# Patient Record
Sex: Female | Born: 1960 | Race: Black or African American | Hispanic: No | Smoking: Never smoker
Health system: Southern US, Community
[De-identification: ages and names within clinical notes are randomized; demographics above are authoritative.]

## PROBLEM LIST (undated history)

## (undated) DIAGNOSIS — E7800 Pure hypercholesterolemia, unspecified: Secondary | ICD-10-CM

## (undated) DIAGNOSIS — E119 Type 2 diabetes mellitus without complications: Secondary | ICD-10-CM

## (undated) DIAGNOSIS — I671 Cerebral aneurysm, nonruptured: Secondary | ICD-10-CM

## (undated) DIAGNOSIS — I10 Essential (primary) hypertension: Secondary | ICD-10-CM

## (undated) HISTORY — PX: BRAIN SURGERY: SHX531

## (undated) HISTORY — PX: CEREBRAL ANEURYSM REPAIR: SHX164

## (undated) HISTORY — DX: Type 2 diabetes mellitus without complications: E11.9

---

## 1998-07-17 ENCOUNTER — Emergency Department (HOSPITAL_COMMUNITY): Admission: EM | Admit: 1998-07-17 | Discharge: 1998-07-17 | Payer: Self-pay | Admitting: Emergency Medicine

## 2001-05-21 ENCOUNTER — Encounter: Payer: Self-pay | Admitting: Emergency Medicine

## 2001-05-21 ENCOUNTER — Emergency Department (HOSPITAL_COMMUNITY): Admission: EM | Admit: 2001-05-21 | Discharge: 2001-05-21 | Payer: Self-pay | Admitting: Emergency Medicine

## 2001-06-06 ENCOUNTER — Emergency Department (HOSPITAL_COMMUNITY): Admission: EM | Admit: 2001-06-06 | Discharge: 2001-06-06 | Payer: Self-pay | Admitting: Emergency Medicine

## 2001-10-17 ENCOUNTER — Encounter: Payer: Self-pay | Admitting: Internal Medicine

## 2001-10-17 ENCOUNTER — Ambulatory Visit (HOSPITAL_COMMUNITY): Admission: RE | Admit: 2001-10-17 | Discharge: 2001-10-17 | Payer: Self-pay | Admitting: Internal Medicine

## 2003-03-07 ENCOUNTER — Encounter: Payer: Self-pay | Admitting: Emergency Medicine

## 2003-03-07 ENCOUNTER — Inpatient Hospital Stay (HOSPITAL_COMMUNITY): Admission: EM | Admit: 2003-03-07 | Discharge: 2003-03-18 | Payer: Self-pay | Admitting: Emergency Medicine

## 2003-04-27 ENCOUNTER — Encounter: Admission: RE | Admit: 2003-04-27 | Discharge: 2003-04-27 | Payer: Self-pay | Admitting: Neurosurgery

## 2003-09-01 ENCOUNTER — Ambulatory Visit (HOSPITAL_COMMUNITY): Admission: RE | Admit: 2003-09-01 | Discharge: 2003-09-01 | Payer: Self-pay | Admitting: Family Medicine

## 2003-11-11 ENCOUNTER — Ambulatory Visit: Payer: Self-pay | Admitting: *Deleted

## 2004-01-12 ENCOUNTER — Ambulatory Visit: Payer: Self-pay | Admitting: Family Medicine

## 2004-01-18 ENCOUNTER — Ambulatory Visit: Payer: Self-pay | Admitting: Internal Medicine

## 2004-02-16 ENCOUNTER — Ambulatory Visit: Payer: Self-pay | Admitting: Family Medicine

## 2004-07-30 ENCOUNTER — Ambulatory Visit: Payer: Self-pay | Admitting: Internal Medicine

## 2005-05-21 ENCOUNTER — Ambulatory Visit: Payer: Self-pay | Admitting: Family Medicine

## 2005-06-07 ENCOUNTER — Inpatient Hospital Stay (HOSPITAL_COMMUNITY): Admission: EM | Admit: 2005-06-07 | Discharge: 2005-06-11 | Payer: Self-pay | Admitting: Emergency Medicine

## 2005-06-07 ENCOUNTER — Ambulatory Visit: Payer: Self-pay | Admitting: Internal Medicine

## 2005-06-10 ENCOUNTER — Encounter: Payer: Self-pay | Admitting: Cardiology

## 2005-06-10 ENCOUNTER — Encounter: Payer: Self-pay | Admitting: Internal Medicine

## 2005-06-13 ENCOUNTER — Ambulatory Visit: Payer: Self-pay | Admitting: Internal Medicine

## 2005-06-18 ENCOUNTER — Emergency Department (HOSPITAL_COMMUNITY): Admission: EM | Admit: 2005-06-18 | Discharge: 2005-06-18 | Payer: Self-pay | Admitting: Emergency Medicine

## 2005-06-19 ENCOUNTER — Ambulatory Visit (HOSPITAL_COMMUNITY): Admission: RE | Admit: 2005-06-19 | Discharge: 2005-06-19 | Payer: Self-pay | Admitting: Family Medicine

## 2005-06-27 ENCOUNTER — Ambulatory Visit (HOSPITAL_COMMUNITY): Admission: RE | Admit: 2005-06-27 | Discharge: 2005-06-27 | Payer: Self-pay | Admitting: Family Medicine

## 2005-06-28 ENCOUNTER — Ambulatory Visit (HOSPITAL_COMMUNITY): Admission: RE | Admit: 2005-06-28 | Discharge: 2005-06-28 | Payer: Self-pay | Admitting: Family Medicine

## 2005-07-02 ENCOUNTER — Ambulatory Visit: Payer: Self-pay | Admitting: Family Medicine

## 2005-10-23 ENCOUNTER — Emergency Department (HOSPITAL_COMMUNITY): Admission: EM | Admit: 2005-10-23 | Discharge: 2005-10-23 | Payer: Self-pay | Admitting: Emergency Medicine

## 2006-06-30 ENCOUNTER — Ambulatory Visit: Payer: Self-pay | Admitting: Family Medicine

## 2006-09-02 ENCOUNTER — Emergency Department (HOSPITAL_COMMUNITY): Admission: EM | Admit: 2006-09-02 | Discharge: 2006-09-02 | Payer: Self-pay | Admitting: Emergency Medicine

## 2006-09-24 ENCOUNTER — Encounter (INDEPENDENT_AMBULATORY_CARE_PROVIDER_SITE_OTHER): Payer: Self-pay | Admitting: *Deleted

## 2007-01-21 ENCOUNTER — Encounter (INDEPENDENT_AMBULATORY_CARE_PROVIDER_SITE_OTHER): Payer: Self-pay | Admitting: Family Medicine

## 2007-01-21 ENCOUNTER — Ambulatory Visit: Payer: Self-pay | Admitting: Family Medicine

## 2007-01-21 LAB — CONVERTED CEMR LAB
Albumin: 4.1 g/dL (ref 3.5–5.2)
CO2: 23 meq/L (ref 19–32)
Calcium: 8.9 mg/dL (ref 8.4–10.5)
Chloride: 106 meq/L (ref 96–112)
Cholesterol: 179 mg/dL (ref 0–200)
Glucose, Bld: 220 mg/dL — ABNORMAL HIGH (ref 70–99)

## 2007-01-30 ENCOUNTER — Ambulatory Visit (HOSPITAL_COMMUNITY): Admission: RE | Admit: 2007-01-30 | Discharge: 2007-01-30 | Payer: Self-pay | Admitting: Family Medicine

## 2007-02-25 ENCOUNTER — Emergency Department (HOSPITAL_COMMUNITY): Admission: EM | Admit: 2007-02-25 | Discharge: 2007-02-25 | Payer: Self-pay | Admitting: Family Medicine

## 2007-03-04 ENCOUNTER — Emergency Department (HOSPITAL_COMMUNITY): Admission: EM | Admit: 2007-03-04 | Discharge: 2007-03-04 | Payer: Self-pay | Admitting: Emergency Medicine

## 2007-03-29 ENCOUNTER — Emergency Department (HOSPITAL_COMMUNITY): Admission: EM | Admit: 2007-03-29 | Discharge: 2007-03-29 | Payer: Self-pay | Admitting: Emergency Medicine

## 2007-06-08 ENCOUNTER — Encounter (INDEPENDENT_AMBULATORY_CARE_PROVIDER_SITE_OTHER): Payer: Self-pay | Admitting: Family Medicine

## 2007-06-08 ENCOUNTER — Ambulatory Visit: Payer: Self-pay | Admitting: Internal Medicine

## 2007-06-08 LAB — CONVERTED CEMR LAB
ALT: 13 units/L (ref 0–35)
AST: 16 units/L (ref 0–37)
Albumin: 3.9 g/dL (ref 3.5–5.2)
CO2: 25 meq/L (ref 19–32)
Calcium: 8.9 mg/dL (ref 8.4–10.5)
Creatinine, Ser: 0.89 mg/dL (ref 0.40–1.20)
Glucose, Bld: 91 mg/dL (ref 70–99)
LDL Cholesterol: 57 mg/dL (ref 0–99)
Total Bilirubin: 0.4 mg/dL (ref 0.3–1.2)
Total CHOL/HDL Ratio: 3.1
Total Protein: 6.8 g/dL (ref 6.0–8.3)
Triglycerides: 64 mg/dL (ref <150)

## 2007-07-22 ENCOUNTER — Ambulatory Visit: Payer: Self-pay | Admitting: Family Medicine

## 2007-08-23 ENCOUNTER — Emergency Department (HOSPITAL_COMMUNITY): Admission: EM | Admit: 2007-08-23 | Discharge: 2007-08-23 | Payer: Self-pay | Admitting: Emergency Medicine

## 2007-09-16 ENCOUNTER — Ambulatory Visit: Payer: Self-pay | Admitting: Obstetrics and Gynecology

## 2007-09-16 ENCOUNTER — Encounter: Payer: Self-pay | Admitting: Obstetrics and Gynecology

## 2007-09-16 ENCOUNTER — Ambulatory Visit: Payer: Self-pay | Admitting: Internal Medicine

## 2007-09-16 LAB — CONVERTED CEMR LAB
CO2: 24 meq/L (ref 19–32)
Creatinine, Ser: 0.84 mg/dL (ref 0.40–1.20)
Glucose, Bld: 108 mg/dL — ABNORMAL HIGH (ref 70–99)
Sodium: 139 meq/L (ref 135–145)

## 2007-10-16 ENCOUNTER — Emergency Department (HOSPITAL_COMMUNITY): Admission: EM | Admit: 2007-10-16 | Discharge: 2007-10-16 | Payer: Self-pay | Admitting: Emergency Medicine

## 2008-01-20 ENCOUNTER — Emergency Department (HOSPITAL_COMMUNITY): Admission: EM | Admit: 2008-01-20 | Discharge: 2008-01-20 | Payer: Self-pay | Admitting: Family Medicine

## 2008-02-25 ENCOUNTER — Ambulatory Visit: Payer: Self-pay | Admitting: Family Medicine

## 2009-01-05 ENCOUNTER — Emergency Department (HOSPITAL_COMMUNITY): Admission: EM | Admit: 2009-01-05 | Discharge: 2009-01-05 | Payer: Self-pay | Admitting: Emergency Medicine

## 2009-05-16 ENCOUNTER — Emergency Department (HOSPITAL_COMMUNITY): Admission: EM | Admit: 2009-05-16 | Discharge: 2009-05-16 | Payer: Self-pay | Admitting: Emergency Medicine

## 2010-01-09 ENCOUNTER — Emergency Department (HOSPITAL_COMMUNITY)
Admission: EM | Admit: 2010-01-09 | Discharge: 2010-01-09 | Payer: Self-pay | Source: Home / Self Care | Admitting: Emergency Medicine

## 2010-01-30 ENCOUNTER — Emergency Department (HOSPITAL_COMMUNITY)
Admission: EM | Admit: 2010-01-30 | Discharge: 2010-01-30 | Payer: Self-pay | Source: Home / Self Care | Admitting: Emergency Medicine

## 2010-03-19 LAB — DIFFERENTIAL
Eosinophils Absolute: 0.1 10*3/uL (ref 0.0–0.7)
Eosinophils Relative: 1 % (ref 0–5)
Lymphs Abs: 1.7 10*3/uL (ref 0.7–4.0)
Monocytes Absolute: 0.4 10*3/uL (ref 0.1–1.0)
Monocytes Relative: 5 % (ref 3–12)
Neutro Abs: 4.7 10*3/uL (ref 1.7–7.7)

## 2010-03-19 LAB — URINALYSIS, ROUTINE W REFLEX MICROSCOPIC
Bilirubin Urine: NEGATIVE
Glucose, UA: >1000 mg/dL — AB
Hgb urine dipstick: NEGATIVE
Ketones, ur: NEGATIVE mg/dL
Leukocytes, UA: NEGATIVE
Nitrite: POSITIVE — AB
Protein, ur: NEGATIVE mg/dL
Specific Gravity, Urine: 1.026 (ref 1.005–1.030)
Urobilinogen, UA: 0.2 mg/dL (ref 0.0–1.0)
pH: 6.5 (ref 5.0–8.0)

## 2010-03-19 LAB — URINE MICROSCOPIC-ADD ON

## 2010-03-19 LAB — WET PREP, GENITAL
Clue Cells Wet Prep HPF POC: NONE SEEN
Trich, Wet Prep: NONE SEEN
Yeast Wet Prep HPF POC: NONE SEEN

## 2010-03-19 LAB — CBC
Hemoglobin: 12 g/dL (ref 12.0–15.0)
MCHC: 31.8 g/dL (ref 30.0–36.0)
Platelets: 308 10*3/uL (ref 150–400)
RBC: 4.53 MIL/uL (ref 3.87–5.11)
RDW: 13.8 % (ref 11.5–15.5)
WBC: 6.9 10*3/uL (ref 4.0–10.5)

## 2010-03-19 LAB — POCT I-STAT, CHEM 8
Calcium, Ion: 1.18 mmol/L (ref 1.12–1.32)
Creatinine, Ser: 0.7 mg/dL (ref 0.4–1.2)
Glucose, Bld: 221 mg/dL — ABNORMAL HIGH (ref 70–99)
Sodium: 142 mEq/L (ref 135–145)

## 2010-03-19 LAB — GC/CHLAMYDIA PROBE AMP, GENITAL
Chlamydia, DNA Probe: NEGATIVE
GC Probe Amp, Genital: NEGATIVE

## 2010-03-19 LAB — POCT PREGNANCY, URINE: Preg Test, Ur: NEGATIVE

## 2010-04-09 LAB — DIFFERENTIAL
Basophils Absolute: 0.1 10*3/uL (ref 0.0–0.1)
Basophils Relative: 1 % (ref 0–1)
Eosinophils Absolute: 0.1 10*3/uL (ref 0.0–0.7)
Eosinophils Relative: 1 % (ref 0–5)
Lymphocytes Relative: 23 % (ref 12–46)
Monocytes Absolute: 0.6 10*3/uL (ref 0.1–1.0)
Monocytes Relative: 8 % (ref 3–12)
Neutro Abs: 5 10*3/uL (ref 1.7–7.7)
Neutrophils Relative %: 68 % (ref 43–77)

## 2010-04-09 LAB — CBC
HCT: 40.2 % (ref 36.0–46.0)
MCHC: 32.6 g/dL (ref 30.0–36.0)
MCV: 82.4 fL (ref 78.0–100.0)
RDW: 15.4 % (ref 11.5–15.5)

## 2010-04-09 LAB — URINALYSIS, ROUTINE W REFLEX MICROSCOPIC
Glucose, UA: 500 mg/dL — AB
Nitrite: NEGATIVE
Specific Gravity, Urine: 1.019 (ref 1.005–1.030)
Urobilinogen, UA: 0.2 mg/dL (ref 0.0–1.0)

## 2010-04-09 LAB — COMPREHENSIVE METABOLIC PANEL
ALT: 17 U/L (ref 0–35)
AST: 24 U/L (ref 0–37)
BUN: 11 mg/dL (ref 6–23)
CO2: 25 mEq/L (ref 19–32)
Creatinine, Ser: 0.75 mg/dL (ref 0.4–1.2)
GFR calc Af Amer: >60 mL/min (ref >60)
GFR calc non Af Amer: >60 mL/min (ref >60)
Glucose, Bld: 190 mg/dL — ABNORMAL HIGH (ref 70–99)
Sodium: 137 mEq/L (ref 135–145)
Total Bilirubin: 0.4 mg/dL (ref 0.3–1.2)
Total Protein: 8.5 g/dL — ABNORMAL HIGH (ref 6.0–8.3)

## 2010-04-30 ENCOUNTER — Inpatient Hospital Stay (INDEPENDENT_AMBULATORY_CARE_PROVIDER_SITE_OTHER)
Admission: RE | Admit: 2010-04-30 | Discharge: 2010-04-30 | Disposition: A | Discharge status: Home / Self Care | Payer: Self-pay | Source: Ambulatory Visit | Attending: Family Medicine | Admitting: Family Medicine

## 2010-04-30 DIAGNOSIS — I10 Essential (primary) hypertension: Secondary | ICD-10-CM

## 2010-04-30 LAB — POCT I-STAT, CHEM 8
BUN: 15 mg/dL (ref 6–23)
Potassium: 3.4 mEq/L — ABNORMAL LOW (ref 3.5–5.1)

## 2010-05-22 NOTE — Group Therapy Note (Signed)
NAME:  Samantha [name], Samantha [name] NO.:  [id number]   MEDICAL RECORD NO.:  [id number]          PATIENT TYPE:  WOC   LOCATION:  [facility]                   FACILITY:  [facility]   PHYSICIAN:  Argentina Donovan, MD        DATE OF BIRTH:  01/28/1960   DATE OF SERVICE:  09/16/2007                                  CLINIC NOTE   The patient is a 50 year old African American nulligravida female who  was sent in by [facility] to counsel on tubal ligation.  She is a  recent widow, has only had her husband as a sex partner for many years  and does not know whether he was sterile but she never got pregnant.  I  have told her that probably she may be sterile.  On the other hand if  she wants to be careful, we can rather than to an operation on her, we  might be able to qualify her for Mirena IUD.  The periods are relatively  heavy.  She thought that was a good idea.  We will have her fill out  those forms today.   EXAMINATION:  ABDOMEN:  Soft, flat, and nontender.  No masses or  organomegaly.  EXTERNAL GENITALIA:  Normal.  BUS within normal limits.  Vagina is clean  and well rugated.  Cervix is clean and nulliparous.  The uterus and  adnexa could not be well outlined because of the habitus of this patient  was 194 pounds and 5 feet tall.  VITAL SIGNS:  Her blood pressure is 160/78.  Her temperature today was  100 and her pulse was 69.   She has no other complaints except they told her at the desk told her  that her temperature was 100, so she has put on a mask.  She has no  cough she has no GI symptoms.  She is currently on Actos and Lipitor.  Other blood pressure medication, she is not sure which.  She is on  cyclobenzaprine and ibuprofen.  She had written in her complaints that  she had blood in the stool.  She said that was a long, long time ago and  she has not had anything recently.   IMPRESSION:  Normal gynecological examination.   PLAN:  Hopefully get secure for her for a free  IUD.           ______________________________  Argentina Donovan, MD     PR/MEDQ  D:  09/16/2007  T:  09/16/2007  Job:  [id number]

## 2010-05-25 NOTE — Consult Note (Signed)
NAMEAMILLIA, BIFFLE NO.:  [id number]   MEDICAL RECORD NO.:  [id number]                   PATIENT TYPE:  INP   LOCATION:  3108                                 FACILITY:  [facility]   PHYSICIAN:  Cristi Loron, M.D.            DATE OF BIRTH:  [birth year]   DATE OF CONSULTATION:  03/07/2003  DATE OF DISCHARGE:                                   CONSULTATION   CHIEF COMPLAINT:  Headache.   HISTORY OF PRESENT ILLNESS:  The patient is a 50 year old black female who  had the acute onset of severe headache last evening.  She was brought to  [facility] via EMS and was evaluated by the emergency room  physician.  The work-up included a cranial CT scan which demonstrated a  subarachnoid hemorrhage and a neurosurgical consultation and care was  requested.  The patient was subsequently transferred to [facility] for neurosurgical care.   Presently, the patient complains of a severe headache.  She also complains  of some nausea and vomiting.   PAST MEDICAL HISTORY:  1. Diabetes mellitus.  2. Hypertension.   PAST SURGICAL HISTORY:  None.   MEDICATIONS PRIOR TO ADMISSION:  The patient takes two diabetes medicines  and a blood pressure medicine which she cannot remember the name of any of  these medications.   ALLERGIES:  No known drug allergies.   FAMILY HISTORY:  The patient's mother is age 76, has hypertension.  The  patient's father is age 60, has diabetes mellitus.   SOCIAL HISTORY:  The patient is married.  She has no children.  She is part-  time employed. She denies tobacco or ethanol drug use.   REVIEW OF SYMPTOMS:  As above.  The patient denies chest pain, abdominal  pain, numbness, tingling, or weakness.   PHYSICAL EXAMINATION:  GENERAL APPEARANCE:  An obese 50 year old black  female complaining of headaches, somewhat somnolent.  HEENT:  Normocephalic and atraumatic.  Pupils are equal, round and reactive  to  light. Extraocular movements intact.  Oropharynx benign.  She has poor  dentition.  NECK:  Supple.  There are no masses or deformities, tracheal deviation,  jugular venous distension, carotid bruits.  CHEST:  Thorax symmetric.  LUNGS:  Clear to auscultation.  CARDIOVASCULAR:  Regular rate and rhythm.  ABDOMEN:  Soft and nontender.  Obese.  EXTREMITIES:  Obese.  NEUROLOGIC:  The patient is somnolent but easily arousable.  She is oriented  x3.  Glasgow Coma Scale 14, A3, M6, V5.  Cranial nerves II-XII grossly  intact.  Vision and hearing grossly normal bilaterally.  Motor strength is  5/5 over bilateral biceps, triceps, hand grip, quadriceps, gastrocnemius,  extensor hallucis longus.  Deep tendon reflexes are approximately 1/4 in the  bilateral biceps, triceps, quadriceps.  There is no ankle clonus.  Sensory  examination is intact throughout light touch in all tested dermatomes  bilaterally.  Cerebellar examination is intact.  Rapid alternating movements  of the upper extremities bilaterally.   IMAGING STUDIES:  I have reviewed the patient's cranial CT scan performed  without contrast at [facility].  It demonstrates that the patient  has a diffuse subarachnoid hemorrhage around the circle of Willis somewhat  worse on the right.  There is no significant hydrocephalus or hematomas.   ASSESSMENT/PLAN:  1. Nontraumatic subarachnoid hemorrhage.  I have discussed the situation     with the patient and her husband (at the patient's request).  I have told     them I suspect an intracranial aneurysm.  I have recommended she undergo     a cerebral arteriogram and if an aneurysm is found, either craniotomy for     clipping of the aneurysm or aneurysmal coiling.  I have answered their     questions.  I have arranged for the arteriogram and will decide what to     do from there.  In the meantime, I will start her on Decadron, Dilantin,     nimodipine, Pepcid, etc.  2. History of diabetes  mellitus and hypertension as noted.                                               Cristi Loron, M.D.    JDJ/MEDQ  D:  03/07/2003  T:  03/07/2003  Job:  [id number]

## 2010-05-25 NOTE — H&P (Signed)
NAME:  Samantha [name], Samantha [name] NO.:  [id number]   MEDICAL RECORD NO.:  [id number]          PATIENT TYPE:  INP   LOCATION:  0104                         [facility]ACILITY:  [facility]   PHYSICIAN:  Michaelyn Barter, M.D. DATE OF BIRTH:  07/27/1960   DATE OF ADMISSION:  06/07/2005  DATE OF DISCHARGE:                                HISTORY & PHYSICAL   PRIMARY CARE PHYSICIAN:  Unassigned/[facility].   CHIEF COMPLAINT:  Shortness of breath and chest pain.   HISTORY OF PRESENT ILLNESS:  Samantha [name] is a 50 year old female who is a  very poor historian.  She is accompanied by her husband, Mr. Diani Jillson, who also happens to be a poor historian.  I have attempted to guide  their history, which at times has information that is somewhat conflicting,  but according to the patient's husband, who does quite a bit of the talking,  the patient has been having shortness of breath when she ambulates long  distances, for at least 1 month.  He states that it was not initially  noticeable until recently when he developed problems with his car which  necessitated both of them to do more walking.  Both of them state that the  patient has been trying to receive disability for quite some time and she  actually went to see a physician at the Urgent Care on Jun 04, 2005, as an  attempt to obtain disability.  At that time, she was told that he needed to  have her heart checked out secondary to some symptoms that she had mentioned  to the physician.  The patient states that lately she has been walking  outside, and states that whenever she walks 1-2 blocks, she develops  shortness of breath along with chest pain.  In order to alleviate her  symptoms, she sits down and rests, at which time her symptoms typically  resolve.  In addition, she also states that taking aspirin has also helped  to alleviate her symptoms.  She denies having any PND or orthopnea.  No  nausea, vomiting, fevers, or chills.  No  cough.  Her chest pain is described  as diffusely across her chest, but she points to the central region of her  chest as if that is the initial point of origin and it radiates to both her  right and left side.  The pain is described as sharp and she vaguely states  that she develops shoulder pain.  Initially, she did not believe there was a  definite relationship, but then later stated that the pain did travel to her  shoulder.  She went on to state that she developed chest pain last night at  approximately 7:30 p.m.  She went on to note the pain itself lasted  throughout the course of the night up until the time that she came to the ER  and was given pain medications.  The pain is described as 10/10 in  intensity.  There is no diaphoresis that accompanies it.  In addition, the  denies having any bright red blood coming from her rectum.  She has not  noticed any black stools or any other obvious sign of GI blood loss.  She  states that her periods have been heavy and they typically last 3-4 days,  but her cycles have been regular.  The last time she had a period was the  earlier portion of May.   PAST MEDICAL HISTORY:  1.  The patient has a history of subarachnoid hemorrhage that occurred back      in February of 2005.  During that time, she underwent a cerebral      angiogram which demonstrated a posterior communicating artery segment      aneurysm.  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      Tressie Stalker at that particular time.  2.  The patient has a history of hypertension.  3.  Diabetes mellitus.  4.  Hyperlipidemia.   ALLERGIES:  NO KNOWN DRUG ALLERGIES.   HOME MEDICATIONS:  1.  The patient takes Avandia.  2.  The patient takes medication for her blood pressure, but she cannot      recall the name of it.  It sounds  like lisinopril when she tries to      spell it.  3.  Medication for cholesterol which she cannot recall.   FAMILY HISTORY:  Mother has history of heart disease.  Father has history of  diabetes mellitus and heart disease.   SOCIAL HISTORY:  Cigarettes:  The patient denies.  Alcohol:  The patient  denies.  Street drugs:  The patient denies.   REVIEW OF SYSTEMS:  As per HPI, otherwise all other systems are negative.   PHYSICAL EXAMINATION:  GENERAL:  The patient is awake, cooperative, and in  no obvious distress.  She shows no signs of respiratory compromise, and she  is a morbidly obese female.  VITALS:  Her blood pressure is 150/89, heart rate 79, respirations 20, O2  sat 100% on room air, temp 97.5.  HEENT:  Normocephalic, atraumatic.  Conjunctivae are pale.  Extraocular  movements are intact.  Pupil are equally reactive to light.  Oral mucosa is  pink, moist; no exudates, no thrush.  NECK:  No JVD.  Soft carotid upstrokes bilaterally with the right carotid  artery pulsation being greater than the left.  Questionable bruit  auscultated on the left side.  No lymphadenopathy.  Thyroid is full.  CARDIAC:  S1, S2 is present.  Regular rate and rhythm.  No murmurs, no  gallops, no rubs.  RESPIRATORY:  Breath sounds are decreased bilaterally.  No crackles or  wheezes.  ABDOMEN:  Soft.  There is some tenderness above the umbilicus and over the  left abdominal quadrants.  Bowel sounds are present x4 quadrants.  No  rebound, no guarding, no hepatosplenomegaly.  GUAIAC:  Dr. Rosalia Hammers the ER attending assured me that a rectal exam was done and  the patient guaiac'd negative.  EXTREMITIES:  The patient has trace bilateral distal leg edema.  NEUROLOGIC:  Neurologically, the patient is alert and oriented x3.  Cranial  nerves II-XII are intact. Musculoskeletal is 5/5.   White blood cell count 4.7, hemoglobin 7.8, hematocrit 24.6, platelets 407. Sodium 135, potassium 3.4, chloride 103, CO2 27, BUN  11, creatinine 0.8,  glucose 147, calcium 8.6.  CK-MB, POC less than 1.0; troponin  I, POC less  than 0.05; myoglobin, POC 45.8.   EKG is normal sinus rhythm.  No Q waves.  No ST segment changes.   Chest x-ray reveals no acute abnormalities.   ASSESSMENT AND PLAN:  Mrs. Gulas is a 50 year old female here for  evaluation of chest pain and shortness of breath associated with exertion.  1.  Shortness of breath plus chest pain with exertion:  The etiology of this      is cardiac versus noncardiac in origin.  The patient has a hemoglobin of      7.8; therefore, severe anemia could have precipitated the patient's      current complaints.  However, she also has multiple cardiovascular risk      factors, including obesity, hypertension, diabetes mellitus, and 2      parents with histories of heart disease.  Therefore, we will rule the      patient out for a myocardial infarction via checking troponin I, CK-MB      x3 q.8 h. apart.  We will also order a 2-D echocardiogram, along with a      BNP.  Will order a D-dimer.  We will provide aspirin, sublingual      nitroglycerine on a p.r.n. basis, along with oxygen for now, and we will      consider cardiology consultation.  The patient may need a cardiovascular      stress test which could be done perhaps as either an inpatient or      outpatient.  Given the fact that it the weekend it may be more feasible      to have it scheduled as an outpatient.  2.  Severe anemia:  The source of this is questionable.  I was informed that      the patient does have a history of anemia that dates back quite some      time.  Given the fact that she does relay having heavy monthly menstrual      cycles, this may contribute to the patient's current hemoglobin.  We      will go ahead and type and cross the patient 2 units of packed RBCs and      then transfuse.  Again, the severe anemia may have contributed to the      patient's current symptoms.  3.  Hypertension:   This is currently under control.  We will confirm the      patient's home medications then resume them.  We will also start a beta      blocker given the patient's current symptoms.  4.  History of diabetes mellitus:  We will provide Accu-Cheks, along with      sliding scale insulin, and we will check a hemoglobin A1c.  5.  Heavy menstrual cycles:  We will monitor this for now.  We will also      consider scheduling a followup appointment with OB/Gyne once the patient      is discharged from the hospital.  6.  Hypokalemia:  We will replace.  7.  Gastrointestinal prophylaxis:  We will provide Protonix.  8.  Deep venous thrombosis prophylaxis:  We will provide Lovenox.      Michaelyn Barter, M.D.  Electronically Signed     OR/MEDQ  D:  06/07/2005  T:  06/07/2005  Job:  [id number]

## 2010-05-25 NOTE — Consult Note (Signed)
Samantha [name], Samantha [name]               ACCOUNT NO.:  [id number]   MEDICAL RECORD NO.:  [id number]          PATIENT TYPE:  INP   LOCATION:  1424                         FACILITY:  [facility]   PHYSICIAN:  Doylene Canning. Ladona Ridgel, M.D.  DATE OF BIRTH:  [birth year]   DATE OF CONSULTATION:  06/08/2005  DATE OF DISCHARGE:                                   CONSULTATION   INDICATION FOR CONSULTATION:  Evaluation of chest pain and shortness of  breath in the setting of severe anemia in a patient with multiple cardiac  risk factors.   HISTORY OF PRESENT ILLNESS:  The patient is a pleasant 50 year old obese  woman with a history of hypertension, diabetes for many years and obesity.  She notes that over the last month she had increasing shortness of breath  which worsened on the day of admission.  She was seen on Jun 04, 2005 with  the above symptoms and was essentially found to have severe anemia with  hematocrit of 24.  Associated with this has been substernal chest pressure  particularly with exertion and severe weakness and fatigue with exertion.  She denies peripheral edema.  She denies syncope.  She does have a history  of hemorrhoidal bleeding and heavy periods.   Her shortness of breath and chest pain are not associated with nausea,  vomiting, fevers or chills.  She describes the pain as being sharp and  traveling over to the left shoulder.  There is no clear correlation with  exertion.  She is now painfree. There is no diaphoresis associated.  She  admits to very heavy menses.   PAST MEDICAL HISTORY:  Notable for diabetes which was initially diagnosed in  1984, hypertension for many years, dyslipidemia.   FAMILY HISTORY:  Notable for heart disease both in her mother and father.   SOCIAL HISTORY:  The patient denies tobacco or ethanol use or recreational  drug use.  She has no history of any allergies.   REVIEW OF SYSTEMS:  Negative for vision or hearing problems.  She denies  nausea, vomiting,  diarrhea, constipation, polyuria, polydipsia, heat or cold  intolerance, recent weight changes, though she has gradually gained weight  over the years.  She denies claudication, cough or hemoptysis.  She denies  any skin changes.  She denies any arthritic complaints.  She denies any  neurologic problems.  Please see the history of present illness for the rest  of review of systems.  Otherwise all systems reviewed and found to be  negative.   PHYSICAL EXAM:  She is a pleasant well-appearing, obese 49 year old woman in  no acute distress.  The blood pressure was 160/90, the pulse was 74 and  regular, respirations are 20, temperature was 98.  HEENT exam is  normocephalic and atraumatic.  Pupils equal and round.  Oropharynx is moist.  Sclerae is anicteric.  The neck revealed no jugular distension.  There is no  thyromegaly.  Trachea is midline.  Carotids are 2+ and symmetric.  Lungs are  clear bilaterally to auscultation without no wheezes, rales or rhonchi  noticed.  There is no  increased work of breathing.  Cardiovascular exam  reveals regular rate and rhythm with normal S1-S2.  There are no murmurs,  rubs or gallops.  The PMI was not laterally displaced nor was it enlarged.  Abdominal exam was obese, nontender, nondistended.  There was no  organomegaly.  Bowel sounds are present.  There is no rebound or guarding.  Extremities demonstrate no cyanosis, clubbing or edema.  Pulses were 2+ and  symmetric.  Neurologic exam was alert and oriented x3.  Cranial nerves were  intact.  Strength was 5/5 and symmetric.   The EKG demonstrates sinus bradycardia with prior septal MI pattern  (nondiagnostic).   IMPRESSION:  1.  Chest pain and shortness of breath.  2.  Multiple cardiac risk factors.  3.  Severe anemia with hematocrit 24.  4.  Hypertension.  5.  Obesity.  6.  Diabetes.   DISCUSSION:  I suspect the patient's symptoms are solely do to anemia but  agree that 2-D echo and stress test are  warranted as she has had what sounds  like angina, although some of her symptoms are atypical.  She is clearly  improved after 2 units of packed red cells.  Of course, workup for anemia  will be ongoing.  I do agree that blood loss is high on the differential.  Will plan a stress test on Monday.           ______________________________  Doylene Canning. Ladona Ridgel, M.D.     GWT/MEDQ  D:  06/08/2005  T:  06/08/2005  Job:  [id number]   cc:   Dala Dock

## 2010-05-25 NOTE — Discharge Summary (Signed)
NAME:  Samantha [name], Samantha [name] NO.:  [id number]   MEDICAL RECORD NO.:  [id number]                   PATIENT TYPE:  INP   LOCATION:  3034                                 FACILITY:  [facility]   PHYSICIAN:  Cristi Loron, M.D.            DATE OF BIRTH:  01/14/1960   DATE OF ADMISSION:  03/07/2003  DATE OF DISCHARGE:  03/18/2003                                 DISCHARGE SUMMARY   For full details of this admission, please refer to typed history and  physical.   HISTORY:  The patient is a 50 year old black female who has suffered a  subarachnoid hemorrhage.  She was admitted for such (for further details of  this admission, please refer to typed history and physical).   HOSPITAL COURSE:  The patient underwent a cerebral arteriogram, which  demonstrated a posterior communicating artery segment aneurysm.  Dr.  __________ coiled this.  For full details of this, please refer to his  procedure note.   POSTOPERATIVE COURSE:  The patient's postoperative course was unremarkable.  She was in ICU for some time.  We watched her closely for hydrocephalus and  vasospasm.  She developed neither of these.  We observed her, and by  03/18/2003, the patient was afebrile.  Vital signs stable.  She was doing  well, ambulating well.  She was neurologically normal, and she was  requesting discharge home, and she was therefore discharged home on  03/18/2003.   FINAL DIAGNOSIS:  Subarachnoid hemorrhage.  Procedure performed was cerebral  arteriogram, coiling of aneurysm.   DISCHARGE INSTRUCTIONS:  Patient was instructed to followup with me in 2 to  4 weeks.                                                Cristi Loron, M.D.    JDJ/MEDQ  D:  04/14/2003  T:  04/15/2003  Job:  [id number]

## 2010-05-25 NOTE — Discharge Summary (Signed)
Samantha [name], Samantha [name]               ACCOUNT NO.:  [id number]   MEDICAL RECORD NO.:  [id number]          PATIENT TYPE:  INP   LOCATION:  1424                         [facility]ACILITY:  [facility]   PHYSICIAN:  Jonna L. Robb Matar, M.D.DATE OF BIRTH:  [birth year]   DATE OF ADMISSION:  06/07/2005  DATE OF DISCHARGE:  06/11/2005                                 DISCHARGE SUMMARY   FINAL DIAGNOSES:  1.  Noncardiac chest pain.  2.  Menorrhagia.  3.  Type 2 diabetes.  4.  Hypertension.  5.  Iron-deficiency anemia.  6.  Hyperlipidemia.   PRIMARY CARE PHYSICIAN:  [facility].   CONSULTATIONS:  Cardiology, Leslie.   PROCEDURE:  Cardiolite stress test, June 5.   ALLERGIES:  None.   CODE STATUS:  Full.   HISTORY:  This 50 year old African-American female notes chest pain, dyspnea  on exertion,  for at least the past month.  The pain cleared up when she  rested.  It was a diffuse chest pain that runs into her right shoulder as  well.  The patient denies any black bowel movements or blood in the bowel  movements, but she does have very heavy menstrual cycles.  Her risk factors  include hypertension, diabetes, hyperlipidemia, obesity, and a family  history with both parents having heart disease.   PHYSICAL EXAMINATION:  VITAL SIGNS:  Blood pressure 150/89.  GENERAL:  On admission, she had morbid obesity.  HEENT:  Pale conjunctivae.  HEART/CARDIOVASCULAR:  ABDOMEN:  Tenderness above the umbilicus.  RECTAL:  Stool was heme negative.  EXTREMITIES:  Trace peripheral edema.   Hemoglobin was 7.8.  Cardiac enzymes were negative.   EKG was normal.   Chest x-ray was normal.   HOSPITAL COURSE:  The patient's cardiac enzymes were negative, and  echocardiogram was normal as was a D-dimer.  The patient was transfused up  and felt better.  Ultrasound of the pelvis was normal.   Cardiology was called in evaluation and felt that after she had a Cardiolite  stress test, which was normal, that no further  cardiac workup needed to be  done other than controlling her risk factors.   DISPOSITION:  Patient will be discharged on Vytorin 10/20 daily,  lisinopril/HCTZ 20/25 daily, Avandia 8 daily, omeprazole 20 daily, Nu-Iron  150 daily, and potassium 20 daily.   She is to see her physician at [facility] in one week.  At that point, I  suggested considering at least some serial occult bloods and if any of these  are positive, considering getting a GI workup to rule out that as a source  of iron-deficiency anemia.  I also strongly suggested she get a GYN  evaluation to see if something can be done about her menorrhagia that is a  possible cause of the severe anemia that led to the chest pain.  The patient  should be on a low fat, low fat, low calorie diet.      Jonna L. Robb Matar, M.D.  Electronically Signed     JLB/MEDQ  D:  06/11/2005  T:  06/12/2005  Job:  [id number]   cc:  [facility]

## 2010-09-28 LAB — INFLUENZA A AND B ANTIGEN (CONVERTED LAB)
Inflenza A Ag: NEGATIVE
Influenza B Ag: NEGATIVE

## 2010-10-08 LAB — POCT RAPID STREP A: Streptococcus, Group A Screen (Direct): NEGATIVE

## 2010-10-26 ENCOUNTER — Emergency Department (HOSPITAL_COMMUNITY)
Admission: EM | Admit: 2010-10-26 | Discharge: 2010-10-26 | Disposition: A | Discharge status: Home / Self Care | Payer: Self-pay | Attending: Emergency Medicine | Admitting: Emergency Medicine

## 2010-10-26 DIAGNOSIS — M545 Low back pain, unspecified: Secondary | ICD-10-CM | POA: Insufficient documentation

## 2010-10-26 DIAGNOSIS — E119 Type 2 diabetes mellitus without complications: Secondary | ICD-10-CM | POA: Insufficient documentation

## 2010-10-26 DIAGNOSIS — M546 Pain in thoracic spine: Secondary | ICD-10-CM | POA: Insufficient documentation

## 2010-10-26 DIAGNOSIS — K649 Unspecified hemorrhoids: Secondary | ICD-10-CM | POA: Insufficient documentation

## 2010-10-26 DIAGNOSIS — I10 Essential (primary) hypertension: Secondary | ICD-10-CM | POA: Insufficient documentation

## 2010-10-26 DIAGNOSIS — E785 Hyperlipidemia, unspecified: Secondary | ICD-10-CM | POA: Insufficient documentation

## 2010-10-26 DIAGNOSIS — IMO0002 Reserved for concepts with insufficient information to code with codable children: Secondary | ICD-10-CM | POA: Insufficient documentation

## 2010-10-26 DIAGNOSIS — X58XXXA Exposure to other specified factors, initial encounter: Secondary | ICD-10-CM | POA: Insufficient documentation

## 2010-10-26 LAB — URINALYSIS, ROUTINE W REFLEX MICROSCOPIC
Bilirubin Urine: NEGATIVE
Ketones, ur: NEGATIVE mg/dL
Nitrite: NEGATIVE
Protein, ur: NEGATIVE mg/dL
pH: 6.5 (ref 5.0–8.0)

## 2011-03-04 ENCOUNTER — Other Ambulatory Visit: Payer: Self-pay

## 2011-03-04 ENCOUNTER — Encounter (HOSPITAL_COMMUNITY): Payer: Self-pay | Admitting: *Deleted

## 2011-03-04 ENCOUNTER — Emergency Department (HOSPITAL_COMMUNITY)
Admission: EM | Admit: 2011-03-04 | Discharge: 2011-03-04 | Disposition: A | Discharge status: Home / Self Care | Payer: Self-pay | Attending: Emergency Medicine | Admitting: Emergency Medicine

## 2011-03-04 DIAGNOSIS — Z7982 Long term (current) use of aspirin: Secondary | ICD-10-CM | POA: Insufficient documentation

## 2011-03-04 DIAGNOSIS — E119 Type 2 diabetes mellitus without complications: Secondary | ICD-10-CM | POA: Insufficient documentation

## 2011-03-04 DIAGNOSIS — R739 Hyperglycemia, unspecified: Secondary | ICD-10-CM

## 2011-03-04 DIAGNOSIS — I10 Essential (primary) hypertension: Secondary | ICD-10-CM | POA: Insufficient documentation

## 2011-03-04 HISTORY — DX: Essential (primary) hypertension: I10

## 2011-03-04 LAB — URINALYSIS, ROUTINE W REFLEX MICROSCOPIC
Glucose, UA: 250 mg/dL — AB
Hgb urine dipstick: NEGATIVE
Protein, ur: NEGATIVE mg/dL

## 2011-03-04 MED ORDER — METFORMIN HCL 500 MG PO TABS: 500.0000 mg | ORAL_TABLET | Freq: Two times a day (BID) | ORAL | Status: DC

## 2011-03-04 MED ORDER — LISINOPRIL 10 MG PO TABS: 10.0000 mg | ORAL_TABLET | Freq: Every day | ORAL | Status: DC

## 2011-03-04 NOTE — ED Notes (Signed)
CBG 200 mg/DL

## 2011-03-04 NOTE — Discharge Instructions (Signed)
Please follow up with your doctor for further evaluation and management of your chronic condition.    Diabetes, Frequently Asked Questions WHAT IS DIABETES? Most of the food we eat is turned into glucose (sugar). Our bodies use it for energy. The pancreas makes a hormone called insulin. It helps glucose get into the cells of our bodies. When you have diabetes, your body either does not make enough insulin or cannot use its own insulin as well as it should. This causes sugars to build up in your blood. WHAT ARE THE SYMPTOMS OF DIABETES?  Frequent urination.   Excessive thirst.   Unexplained weight loss.   Extreme hunger.   Blurred vision.   Tingling or numbness in hands or feet.   Feeling very tired much of the time.   Dry, itchy skin.   Sores that are slow to heal.   Yeast infections.  WHAT ARE THE TYPES OF DIABETES? Type 1 Diabetes   About 10% of affected people have this type.   Usually occurs before the age of 3.   Usually occurs in thin to normal weight people.  Type 2 Diabetes  About 90% of affected people have this type.   Usually occurs after the age of 79.   Usually occurs in overweight people.   More likely to have:   A family history of diabetes.   A history of diabetes during pregnancy (gestational diabetes).   High blood pressure.   High cholesterol and triglycerides.  Gestational Diabetes  Occurs in about 4% of pregnancies.   Usually goes away after the baby is born.   More likely to occur in women with:   Family history of diabetes.   Previous gestational diabetes.   Obese.   Over 8 years old.  WHAT IS PRE-DIABETES? Pre-diabetes means your blood glucose is higher than normal, but lower than the diabetes range. It also means you are at risk of getting type 2 diabetes and heart disease. If you are told you have pre-diabetes, have your blood glucose checked again in 1 to 2 years. WHAT IS THE TREATMENT FOR DIABETES? Treatment is aimed  at keeping blood glucose near normal levels at all times. Learning how to manage this yourself is important in treating diabetes. Depending on the type of diabetes you have, your treatment will include one or more of the following:  Monitoring your blood glucose.   Meal planning.   Exercise.   Oral medicine (pills) or insulin.  CAN DIABETES BE PREVENTED? With type 1 diabetes, prevention is more difficult, because the triggers that cause it are not yet known. With type 2 diabetes, prevention is more likely, with lifestyle changes:  Maintain a healthy weight.   Eat healthy.   Exercise.  IS THERE A CURE FOR DIABETES? No, there is no cure for diabetes. There is a lot of research going on that is looking for a cure, and progress is being made. Diabetes can be treated and controlled. People with diabetes can manage their diabetes and lead normal, active lives. SHOULD I BE TESTED FOR DIABETES? If you are at least 49 years old, you should be tested for diabetes. You should be tested again every 3 years. If you are 45 or older and overweight, you may want to get tested more often. If you are younger than 45, overweight, and have one or more of the following risk factors, you should be tested:  Family history of diabetes.   Inactive lifestyle.   High blood pressure.  WHAT ARE SOME OTHER SOURCES FOR INFORMATION ON DIABETES? The following organizations may help in your search for more information on diabetes: National Diabetes Education Program (NDEP) Internet: SolarDiscussions.es American Diabetes Association Internet: http://www.diabetes.org  Juvenile Diabetes Foundation International Internet: WetlessWash.is Document Released: 12/27/2002 Document Revised: 09/05/2010 Document Reviewed: 10/21/2008 [facility] Patient Information 2012 [place], [place].  Hypertension Hypertension is another name for high blood pressure. High blood pressure may mean that your heart needs to  work harder to pump blood. Blood pressure consists of two numbers, which includes a higher number over a lower number (example: 110/72). HOME CARE   Make lifestyle changes as told by your doctor. This may include weight loss and exercise.   Take your blood pressure medicine every day.   Limit how much salt you use.   Stop smoking if you smoke.   Do not use drugs.   Talk to your doctor if you are using decongestants or birth control pills. These medicines might make blood pressure higher.   Females should not drink more than 1 alcoholic drink per day. Males should not drink more than 2 alcoholic drinks per day.   See your doctor as told.  GET HELP RIGHT AWAY IF:   You have a blood pressure reading with a top number of 180 or higher.   You get a very bad headache.   You get blurred or changing vision.   You feel confused.   You feel weak, numb, or faint.   You get chest or belly (abdominal) pain.   You throw up (vomit).   You cannot breathe very well.  MAKE SURE YOU:   Understand these instructions.   Will watch your condition.   Will get help right away if you are not doing well or get worse.  Document Released: 06/12/2007 Document Revised: 09/05/2010 Document Reviewed: 06/12/2007 [facility] Patient Information 2012 [place], [place].   RESOURCE GUIDE  Dental Problems  Patients with Medicaid: [facility]                     [phone] [address]                                           Phone:  [phone]                                                  If unable to pay or uninsured, contact:  [facility] or [facility] to become qualified for the adult dental clinic.  Chronic Pain Problems Contact [facility]  [phone] Patients need to be referred by their primary care doctor.  Insufficient Money for Medicine Contact [employer] Way:  call "211" or [facility] [phone].  No Primary Care Doctor Call  [facility]  [phone] Other agencies that provide inexpensive medical care    [facility]  [phone]    [facility]  [phone]    [facility]  [phone]    [facility]  [phone]    [facility]  [phone]    [facility]  [phone]  Substance Abuse Resources [facility]  [phone] [facility] [phone] [facility] [place] [phone] [facility]  [phone] Residential & Outpatient Substance Abuse Program  [phone]  Psychological Services [facility]  [phone] [facility]  [phone] [facility]   [phone] (emergency services [phone])  Abuse/Neglect [facility] Child Abuse Hotline 802 107 1227 [facility] Child Abuse Hotline 2032864030 (After Hours)  Emergency Shelter [facility] [phone]  [facility] [phone] [facility] [phone]  MRSA Hotline #:   562-878-5156    [facility] Resources  [facility]  United [employer]                           [facility] Dept. [address] [place]                     [address]         [address]  Blondell Reveal Phone:  [phone]                                  Phone:  [phone]                   Phone:  [phone]  [facility] Phone:  [phone]  [facility] Child Abuse Hotline [phone] [phone] (After Hours)

## 2011-03-04 NOTE — ED Notes (Signed)
Pt stated having problem voiding, bladder feel tight, stated my BP is also high.

## 2011-03-04 NOTE — ED Provider Notes (Signed)
History     CSN: [id number]  Arrival date & time 03/04/11  0614   First MD Initiated Contact with Patient 03/04/11 [phone]      Chief Complaint  Patient presents with  . Hypertension    (Consider location/radiation/quality/duration/timing/severity/associated sxs/prior treatment) HPI  51 year old female with history of diabetes and history hypertension is presents. with a chief complaints of elevated blood pressure. Patient states she has been without a regular pressure medication and her diabetes medication for the past 3 months. States she does not have money to afford the medication and has not follow up with her primary care Dr. Patient states she checks her blood pressure at a drugstore yesterday and noticed that her blood pressure was elevated. At which time she denies any headache, double vision, chest pain, shortness of breath, weakness or numbness. She does endorse occasional headaches, which improves with ibuprofen. She denies nausea, vomiting, abdominal pain.  Patient has history of diabetes but does not take insulin. She does not check her blood sugar on a regular basis.  Past Medical History  Diagnosis Date  . Hypertension   . Diabetes mellitus     History reviewed. No pertinent past surgical history.  History reviewed. No pertinent family history.  History  Substance Use Topics  . Smoking status: Not on file  . Smokeless tobacco: Not on file  . Alcohol Use:     OB History    Grav Para Term Preterm Abortions TAB SAB Ect Mult Living                  Review of Systems  All other systems reviewed and are negative.    Allergies  Review of patient's allergies indicates no known allergies.  Home Medications   Current Outpatient Rx  Name Route Sig Dispense Refill  . ASPIRIN EC 81 MG PO TBEC Oral Take 81 mg by mouth daily.    Marland Kitchen CINNAMON PO Oral Take 2 tablets by mouth every morning.    . IBUPROFEN 200 MG PO TABS Oral Take 400 mg by mouth every 6 (six) hours as  needed. For pain      BP 192/93  Pulse 77  Temp(Src) 98.6 F (37 C) (Oral)  Resp 16  SpO2 98%  LMP 02/15/2011  Physical Exam  Nursing note and vitals reviewed. Constitutional: She appears well-developed and well-nourished. No distress.       Awake, alert, nontoxic appearance  HENT:  Head: Atraumatic.  Eyes: Conjunctivae are normal. Right eye exhibits no discharge. Left eye exhibits no discharge.  Neck: Neck supple.  Cardiovascular: Normal rate and regular rhythm.   Pulmonary/Chest: Effort normal. No respiratory distress. She exhibits no tenderness.  Abdominal: Soft. There is no tenderness. There is no rebound.  Musculoskeletal: She exhibits no tenderness.       ROM appears intact, no obvious focal weakness  Neurological:       Mental status and motor strength appears intact  Skin: No rash noted.  Psychiatric: She has a normal mood and affect.    ED Course  Procedures (including critical care time)  Labs Reviewed - No data to display No results found.   No diagnosis found.   Date: 03/04/2011  Rate: 82  Rhythm: normal sinus rhythm  QRS Axis: left  Intervals: normal, borderline prolonged QT interval  ST/T Wave abnormalities: normal  Conduction Disutrbances:none  Narrative Interpretation:   Old EKG Reviewed: unchanged    MDM  Asymptomatic hypertension in a patient it has not take her  regular medication for the past 3 months. She is currently in no acute distress. Her blood pressure is 190 systolic.  I will obtain an EKG. Patient also has history of diabetes. She has not been taking her medication the past 3 months as well. I will obtain a CBG and a urinalysis.  I discussed patient care with my attending.  7:40 AM CBG 200mg /dL.  Urine is negative for UTI.  Repeat BP is 170 systolic.  Pt's most recent list of medications include lisinopril 10mg , and metformin 500mg .  Since pt may not be able to f/u with her PCP while waiting for paperwork for recertification, i will  refill these 2 meds with recommendation to f/u with PCP to resume care as soon as she is able.  Pt voice understanding and agrees with plan.        Fayrene Helper, PA-C 03/04/11 [phone]

## 2011-03-04 NOTE — ED Provider Notes (Signed)
Medical screening examination/treatment/procedure(s) were performed by non-physician practitioner and as supervising physician I was immediately available for consultation/collaboration.   Loren Racer, MD 03/04/11 [phone]

## 2011-03-04 NOTE — ED Notes (Signed)
Pt states she has been out of her BP medications for 3 months. Pt states she checks BP at [facility] and last night her BP was more elevated than normal. Pt states HA for three days.

## 2011-03-04 NOTE — ED Notes (Signed)
Pt states that after voiding her bladder does not have pressure at this time. Pt denies pain. Pt states that her blood pressure was elevated last night at around 206 systolic.

## 2011-03-05 LAB — GLUCOSE, CAPILLARY: Glucose-Capillary: 200 mg/dL — ABNORMAL HIGH (ref 70–99)

## 2011-04-18 ENCOUNTER — Encounter (HOSPITAL_COMMUNITY): Payer: Self-pay | Admitting: *Deleted

## 2011-04-18 ENCOUNTER — Emergency Department (HOSPITAL_COMMUNITY)
Admission: EM | Admit: 2011-04-18 | Discharge: 2011-04-18 | Disposition: A | Discharge status: Home / Self Care | Payer: Self-pay | Attending: Emergency Medicine | Admitting: Emergency Medicine

## 2011-04-18 ENCOUNTER — Emergency Department (HOSPITAL_COMMUNITY): Admission: EM | Admit: 2011-04-18 | Discharge: 2011-04-18 | Discharge status: Absent without leave | Payer: Self-pay

## 2011-04-18 DIAGNOSIS — S91009A Unspecified open wound, unspecified ankle, initial encounter: Secondary | ICD-10-CM | POA: Insufficient documentation

## 2011-04-18 DIAGNOSIS — S81009A Unspecified open wound, unspecified knee, initial encounter: Secondary | ICD-10-CM | POA: Insufficient documentation

## 2011-04-18 DIAGNOSIS — Z23 Encounter for immunization: Secondary | ICD-10-CM | POA: Insufficient documentation

## 2011-04-18 DIAGNOSIS — Z203 Contact with and (suspected) exposure to rabies: Secondary | ICD-10-CM | POA: Insufficient documentation

## 2011-04-18 DIAGNOSIS — IMO0001 Reserved for inherently not codable concepts without codable children: Secondary | ICD-10-CM | POA: Insufficient documentation

## 2011-04-18 DIAGNOSIS — T148XXA Other injury of unspecified body region, initial encounter: Secondary | ICD-10-CM

## 2011-04-18 MED ORDER — TETANUS-DIPHTH-ACELL PERTUSSIS 5-2.5-18.5 LF-MCG/0.5 IM SUSP
0.5000 mL | Freq: Once | INTRAMUSCULAR | Status: AC
  Administered 2011-04-18: 0.5 mL via INTRAMUSCULAR
  Filled 2011-04-18: qty 0.5

## 2011-04-18 MED ORDER — CLINDAMYCIN HCL 150 MG PO CAPS: 300.0000 mg | ORAL_CAPSULE | Freq: Four times a day (QID) | ORAL | Status: AC

## 2011-04-18 MED ORDER — CIPROFLOXACIN HCL 500 MG PO TABS: 500.0000 mg | ORAL_TABLET | Freq: Two times a day (BID) | ORAL | Status: AC

## 2011-04-18 MED ORDER — RABIES VACCINE, PCEC IM SUSR
1.0000 mL | Freq: Once | INTRAMUSCULAR | Status: AC
  Administered 2011-04-18: 1 mL via INTRAMUSCULAR
  Filled 2011-04-18: qty 1

## 2011-04-18 MED ORDER — RABIES IMMUNE GLOBULIN 150 UNIT/ML IM INJ
20.0000 [IU]/kg | INJECTION | Freq: Once | INTRAMUSCULAR | Status: AC
  Administered 2011-04-18: 1800 [IU]
  Filled 2011-04-18: qty 12

## 2011-04-18 NOTE — Discharge Instructions (Signed)
Animal Bite An animal bite can result in a scratch on the skin, deep open cut, puncture of the skin, crush injury, or tearing away of the skin or a body part. Dogs are responsible for most animal bites. Children are bitten more often than adults. An animal bite can range from very mild to more serious. A small bite from your house pet is no cause for alarm. However, some animal bites can become infected or injure a bone or other tissue. You must seek medical care if:  The skin is broken and bleeding does not slow down or stop after 15 minutes.   The puncture is deep and difficult to clean (such as a cat bite).   Pain, warmth, redness, or pus develops around the wound.   The bite is from a stray animal or rodent. There may be a risk of rabies infection.   The bite is from a snake, raccoon, skunk, fox, coyote, or bat. There may be a risk of rabies infection.   The person bitten has a chronic illness such as diabetes, liver disease, or cancer, or the person takes medicine that lowers the immune system.   There is concern about the location and severity of the bite.  It is important to clean and protect an animal bite wound right away to prevent infection. Follow these steps:  Clean the wound with plenty of water and soap.   Apply an antibiotic cream.   Apply gentle pressure over the wound with a clean towel or gauze to slow or stop bleeding.   Elevate the affected area above the heart to help stop any bleeding.   Seek medical care. Getting medical care within 8 hours of the animal bite leads to the best possible outcome.  DIAGNOSIS  Your caregiver will most likely:  Take a detailed history of the animal and the bite injury.   Perform a wound exam.   Take your medical history.  Blood tests or X-rays may be performed. Sometimes, infected bite wounds are cultured and sent to a lab to identify the infectious bacteria.  TREATMENT  Medical treatment will depend on the location and type of  animal bite as well as the patient's medical history. Treatment may include:  Wound care, such as cleaning and flushing the wound with saline solution, bandaging, and elevating the affected area.   Antibiotics.   Tetanus immunization.   Rabies immunization.   Leaving the wound open to heal. This is often done with animal bites, due to the high risk of infection. However, in certain cases, wound closure with stitches, wound adhesive, skin adhesive strips, or staples may be used.  Infected bites that are left untreated may require intravenous (IV) antibiotics and surgical treatment in the hospital. HOME CARE INSTRUCTIONS  Follow your caregiver's instructions for wound care.   Take all medicines as directed.   If your caregiver prescribes antibiotics, take them as directed. Finish them even if you start to feel better.   Follow up with your caregiver for further exams or immunizations as directed.  You may need a tetanus shot if:  You cannot remember when you had your last tetanus shot.   You have never had a tetanus shot.   The injury broke your skin.  If you get a tetanus shot, your arm may swell, get red, and feel warm to the touch. This is common and not a problem. If you need a tetanus shot and you choose not to have one, there is a   rare chance of getting tetanus. Sickness from tetanus can be serious. SEEK MEDICAL CARE IF:  You notice warmth, redness, soreness, swelling, pus discharge, or a bad smell coming from the wound.   You have a red line on the skin coming from the wound.   You have a fever, chills, or a general ill feeling.   You have nausea or vomiting.   You have continued or worsening pain.   You have trouble moving the injured part.   You have other questions or concerns.  MAKE SURE YOU:  Understand these instructions.   Will watch your condition.   Will get help right away if you are not doing well or get worse.  Document Released: 09/11/2010  Document Revised: 12/13/2010 Document Reviewed: 09/11/2010 [facility] Patient Information 2012 [place], [place].  Please be sure to followup with your rabies shots as we discussed

## 2011-04-18 NOTE — ED Notes (Signed)
Patient states she was bit today on her lower left leg by a stray cat that was bitten by a fox x 1 week ago. No bleeding is viisable and patient is resting with NAD at this time.

## 2011-04-18 NOTE — ED Notes (Signed)
Pt states that she was bit by a stray cat today.  Pt has small puncture to her left lower leg.  No bleeding noted.

## 2011-04-18 NOTE — ED Provider Notes (Signed)
History     CSN: [id number]  Arrival date & time 04/18/11  1738   First MD Initiated Contact with Patient 04/18/11 1829      Chief Complaint  Patient presents with  . Animal Bite     Patient is a 51 y.o. female presenting with animal bite. The history is provided by the patient.  Animal Bite  The incident occurred today. The incident occurred at home. There is an injury to the left lower leg. The patient is experiencing no pain. It is unlikely that a foreign body is present. Pertinent negatives include no focal weakness.  her course is stable at this time pt presents after cat bite to left LE This occurred today She reports a stray cat bit her on leg.  She reports this cat was recently attacked by a fox She denies fever/chills/weakness   Past Medical History  Diagnosis Date  . Hypertension   . Diabetes mellitus     History reviewed. No pertinent past surgical history.  History reviewed. No pertinent family history.  History  Substance Use Topics  . Smoking status: Not on file  . Smokeless tobacco: Not on file  . Alcohol Use:     OB History    Grav Para Term Preterm Abortions TAB SAB Ect Mult Living                  Review of Systems  Constitutional: Negative for fever.  Neurological: Negative for focal weakness.    Allergies  Review of patient's allergies indicates no known allergies.  Home Medications   Current Outpatient Rx  Name Route Sig Dispense Refill  . ASPIRIN EC 81 MG PO TBEC Oral Take 81 mg by mouth daily.    . IBUPROFEN 200 MG PO TABS Oral Take 400 mg by mouth every 6 (six) hours as needed. For pain    . LISINOPRIL 10 MG PO TABS Oral Take 10 mg by mouth daily.    Marland Kitchen METFORMIN HCL 500 MG PO TABS Oral Take 1,000 mg by mouth 2 (two) times daily with a meal.    . CIPROFLOXACIN HCL 500 MG PO TABS Oral Take 1 tablet (500 mg total) by mouth every 12 (twelve) hours. 10 tablet 0  . CLINDAMYCIN HCL 150 MG PO CAPS Oral Take 2 capsules (300 mg total) by  mouth every 6 (six) hours. 28 capsule 0    BP 175/78  Pulse 94  Temp(Src) 98.8 F (37.1 C) (Oral)  Resp 16  Wt 199 lb (90.266 kg)  SpO2 97%  Physical Exam CONSTITUTIONAL: Well developed/well nourished HEAD AND FACE: Normocephalic/atraumatic EYES: EOMI/PERRL ENMT: Mucous membranes moist NECK: supple no meningeal signs CV: S1/S2 noted, no murmurs/rubs/gallops noted LUNGS: Lungs are clear to auscultation bilaterally, no apparent distress ABDOMEN: soft, nontender, no rebound or guarding NEURO: Pt is awake/alert, moves all extremitiesx4 EXTREMITIES: pulses normal, full ROM.  Two small wounds to left LE, no bleeding/streaking/erythema.  No tenderness noted.  No crepitance SKIN: warm, color normal PSYCH: no abnormalities of mood noted  ED Course  Procedures     1. Animal bite    Wound does not appear deep, wound care by nurse Will start clinda/cipro (likely more affordable than augmentin) Rabies vaccine course initiated and discussed with patient need to followup   MDM  Nursing notes reviewed and considered in documentation         Joya Gaskins, MD 04/18/11 1952

## 2011-04-19 NOTE — ED Notes (Signed)
Schedule for rabies vaccine faxed to ED while pt still there; copy also faxed to pharmacy and urgent care. Unable to reach pt at this time to inform her to call urgent care to make appt for vaccines.

## 2011-04-21 ENCOUNTER — Emergency Department (HOSPITAL_COMMUNITY)
Admission: EM | Admit: 2011-04-21 | Discharge: 2011-04-21 | Disposition: A | Discharge status: Home / Self Care | Payer: Self-pay | Source: Home / Self Care

## 2011-04-21 MED ORDER — RABIES VACCINE, PCEC IM SUSR
INTRAMUSCULAR | Status: AC
  Filled 2011-04-21: qty 1

## 2011-04-21 MED ORDER — RABIES VACCINE, PCEC IM SUSR
1.0000 mL | Freq: Once | INTRAMUSCULAR | Status: AC
  Administered 2011-04-21: 1 mL via INTRAMUSCULAR

## 2011-04-21 NOTE — Discharge Instructions (Signed)
Please call [phone] and ask for Rosalita Chessman to schedule additional rabies injections

## 2011-04-21 NOTE — ED Notes (Signed)
Patient here for first follow-up rabies injection

## 2011-04-21 NOTE — ED Notes (Signed)
Pt here for 2nd rabies vaccine. Wound to lt ankle healing well, no redness or drainage noted.

## 2011-05-02 ENCOUNTER — Emergency Department (HOSPITAL_COMMUNITY)
Admission: EM | Admit: 2011-05-02 | Discharge: 2011-05-02 | Disposition: A | Discharge status: Home / Self Care | Payer: Self-pay | Source: Home / Self Care

## 2011-05-02 ENCOUNTER — Encounter (HOSPITAL_COMMUNITY): Payer: Self-pay | Admitting: *Deleted

## 2011-05-02 MED ORDER — RABIES VACCINE, PCEC IM SUSR
INTRAMUSCULAR | Status: AC
  Filled 2011-05-02: qty 1

## 2011-05-02 MED ORDER — RABIES VACCINE, PCEC IM SUSR
1.0000 mL | Freq: Once | INTRAMUSCULAR | Status: AC
  Administered 2011-05-02: 1 mL via INTRAMUSCULAR

## 2011-05-02 NOTE — ED Notes (Signed)
pT  HERE  FOR  NEXT  RABIES SHOT  SHE  MISSED  THE     DAY  7   INJECTION       SHE  IS  A  WEEK  LATE  aMBER  IN THE  PHARMACY  NOTIFIED    GIVE  TODAYS  SHOT   AND  HAVE  PT  RETURN IN  1  WEEK  FOR THE  LAST  INJECTION       THE  NEXT INJECTION  SHOULD  BE  Thursday  MAY  2      SHE  SAYS  SHE  WILL BE  HERE  AROUND  330 PM

## 2011-05-02 NOTE — ED Notes (Signed)
pT  ADVISED  TO  RETURN IN 1  WEEK  FOR THE  FINAL  INJECTION   AND  TO  RETURN IF ANY  PROBLEMS

## 2011-05-09 ENCOUNTER — Emergency Department (INDEPENDENT_AMBULATORY_CARE_PROVIDER_SITE_OTHER)
Admission: EM | Admit: 2011-05-09 | Discharge: 2011-05-09 | Disposition: A | Discharge status: Home / Self Care | Payer: Self-pay | Source: Home / Self Care

## 2011-05-09 ENCOUNTER — Encounter (HOSPITAL_COMMUNITY): Payer: Self-pay | Admitting: *Deleted

## 2011-05-09 DIAGNOSIS — Z23 Encounter for immunization: Secondary | ICD-10-CM

## 2011-05-09 MED ORDER — RABIES VACCINE, PCEC IM SUSR
INTRAMUSCULAR | Status: AC
  Filled 2011-05-09: qty 1

## 2011-05-09 MED ORDER — RABIES VACCINE, PCEC IM SUSR
1.0000 mL | Freq: Once | INTRAMUSCULAR | Status: AC
  Administered 2011-05-09: 1 mL via INTRAMUSCULAR

## 2011-05-09 NOTE — ED Notes (Signed)
Here for last rabies shot for cat bite. Cat had been bitten by a fox 2 weeks prior to this.  Neither animal has been caught by [employer] ([employer]). Bite on L ankle is healed.

## 2011-05-09 NOTE — Discharge Instructions (Signed)
Call if any problems. If any further rabies exposures, go to the ED and get a rabies titer drawn. If it is low you may need a booster shot.

## 2011-07-19 ENCOUNTER — Encounter (HOSPITAL_COMMUNITY): Payer: Self-pay

## 2011-07-19 ENCOUNTER — Emergency Department (HOSPITAL_COMMUNITY)
Admission: EM | Admit: 2011-07-19 | Discharge: 2011-07-19 | Disposition: A | Discharge status: Home / Self Care | Payer: Self-pay | Attending: Emergency Medicine | Admitting: Emergency Medicine

## 2011-07-19 DIAGNOSIS — I10 Essential (primary) hypertension: Secondary | ICD-10-CM | POA: Insufficient documentation

## 2011-07-19 DIAGNOSIS — G629 Polyneuropathy, unspecified: Secondary | ICD-10-CM

## 2011-07-19 DIAGNOSIS — E119 Type 2 diabetes mellitus without complications: Secondary | ICD-10-CM | POA: Insufficient documentation

## 2011-07-19 DIAGNOSIS — G589 Mononeuropathy, unspecified: Secondary | ICD-10-CM | POA: Insufficient documentation

## 2011-07-19 LAB — CBC
HCT: 31.9 % — ABNORMAL LOW (ref 36.0–46.0)
Hemoglobin: 10.2 g/dL — ABNORMAL LOW (ref 12.0–15.0)
MCHC: 32 g/dL (ref 30.0–36.0)
WBC: 6.4 10*3/uL (ref 4.0–10.5)

## 2011-07-19 LAB — POCT I-STAT, CHEM 8
Chloride: 102 mEq/L (ref 96–112)
Creatinine, Ser: 1 mg/dL (ref 0.50–1.10)
Glucose, Bld: 86 mg/dL (ref 70–99)
Potassium: 3.2 mEq/L — ABNORMAL LOW (ref 3.5–5.1)

## 2011-07-19 MED ORDER — GABAPENTIN 300 MG PO CAPS: 300.0000 mg | ORAL_CAPSULE | Freq: Every day | ORAL | Status: DC

## 2011-07-19 MED ORDER — POTASSIUM CHLORIDE CRYS ER 20 MEQ PO TBCR
40.0000 meq | EXTENDED_RELEASE_TABLET | Freq: Two times a day (BID) | ORAL | Status: DC
  Administered 2011-07-19: 40 meq via ORAL
  Filled 2011-07-19: qty 2

## 2011-07-19 MED ORDER — GABAPENTIN 400 MG PO CAPS
400.0000 mg | ORAL_CAPSULE | Freq: Once | ORAL | Status: AC
  Administered 2011-07-19: 400 mg via ORAL
  Filled 2011-07-19: qty 1

## 2011-07-19 NOTE — ED Notes (Signed)
Patient presented to the ER with complaint of pain and swelling to both legs x 1 week. Patient is ambulatory, denies any fever, no shortness of breath. Pt is NAD.

## 2011-07-19 NOTE — ED Provider Notes (Signed)
History     CSN: [id number]  Arrival date & time 07/19/11  5188   First MD Initiated Contact with Patient 07/19/11 [phone]      Chief Complaint  Patient presents with  . Leg Swelling    (Consider location/radiation/quality/duration/timing/severity/associated sxs/prior treatment) HPI  Past Medical History  Diagnosis Date  . Hypertension   . Diabetes mellitus     Past Surgical History  Procedure Date  . Brain surgery     States they went through her stomach to repair  bleed in her head in '05    Family History  Problem Relation Age of Onset  . Hypertension Mother   . Heart failure Mother   . Diabetes Father     History  Substance Use Topics  . Smoking status: Never Smoker   . Smokeless tobacco: Not on file  . Alcohol Use: No    OB History    Grav Para Term Preterm Abortions TAB SAB Ect Mult Living                  Review of Systems  Allergies  Review of patient's allergies indicates no known allergies.  Home Medications   Current Outpatient Rx  Name Route Sig Dispense Refill  . AMLODIPINE BESYLATE 2.5 MG PO TABS Oral Take 2.5 mg by mouth daily.    . ASPIRIN EC 81 MG PO TBEC Oral Take 81 mg by mouth daily.    . ATORVASTATIN CALCIUM 40 MG PO TABS Oral Take 40 mg by mouth daily.    Marland Kitchen GLIMEPIRIDE 4 MG PO TABS Oral Take 4 mg by mouth daily before breakfast.    . LISINOPRIL-HYDROCHLOROTHIAZIDE 20-12.5 MG PO TABS Oral Take 1 tablet by mouth daily.    Marland Kitchen METFORMIN HCL 500 MG PO TABS Oral Take 1,000 mg by mouth 2 (two) times daily with a meal.    . GABAPENTIN 300 MG PO CAPS Oral Take 1 capsule (300 mg total) by mouth daily. 30 capsule 0    BP 151/85  Pulse 70  Temp 97.5 F (36.4 C) (Oral)  Resp 15  Ht 5' (1.524 m)  Wt 198 lb (89.812 kg)  BMI 38.67 kg/m2  SpO2 98%  LMP 07/06/2011  Physical Exam  ED Course  Procedures (including critical care time)  Labs Reviewed  CBC - Abnormal; Notable for the following:    RBC 3.84 (*)     Hemoglobin 10.2 (*)       HCT 31.9 (*)     All other components within normal limits  POCT I-STAT, CHEM 8 - Abnormal; Notable for the following:    Potassium 3.2 (*)     Hemoglobin 11.2 (*)     HCT 33.0 (*)     All other components within normal limits   No results found.   1. Neuropathy       MDM  Medical screening examination/treatment/procedure(s) were performed by non-physician practitioner and as supervising physician I was immediately available for consultation/collaboration.        Derwood Kaplan, MD 07/19/11 2053

## 2011-07-19 NOTE — ED Notes (Signed)
MD at bedside. 

## 2011-07-19 NOTE — ED Provider Notes (Signed)
History     CSN: [id number]  Arrival date & time 07/19/11  4098   First MD Initiated Contact with Patient 07/19/11 [phone]      Chief Complaint  Patient presents with  . Leg Swelling    (Consider location/radiation/quality/duration/timing/severity/associated sxs/prior treatment) Patient is a 51 y.o. female presenting with lower extremity pain. The history is provided by the patient. No language interpreter was used.  Foot Pain This is a new problem. The current episode started in the past 7 days. The problem occurs daily. The problem has been unchanged. Associated symptoms include numbness. Pertinent negatives include no chest pain, fever, nausea, urinary symptoms, vertigo or vomiting. The symptoms are aggravated by walking. She has tried nothing for the symptoms.  Pt here c/o bilateral foot swelling and numbness with burning sensation intermittantly x 1 week.  Known diabetic on metformin. [facility] with appointment on the 25th of July. Denies SOB or chest pain.  Past Medical History  Diagnosis Date  . Hypertension   . Diabetes mellitus     Past Surgical History  Procedure Date  . Brain surgery     States they went through her stomach to repair  bleed in her head in '05    Family History  Problem Relation Age of Onset  . Hypertension Mother   . Heart failure Mother   . Diabetes Father     History  Substance Use Topics  . Smoking status: Never Smoker   . Smokeless tobacco: Not on file  . Alcohol Use: No    OB History    Grav Para Term Preterm Abortions TAB SAB Ect Mult Living                  Review of Systems  Constitutional: Negative.  Negative for fever.  HENT: Negative.   Eyes: Negative.   Respiratory: Negative.  Negative for shortness of breath.   Cardiovascular: Negative.  Negative for chest pain and leg swelling.  Gastrointestinal: Negative.  Negative for nausea and vomiting.  Musculoskeletal: Negative for back pain.       LE pain/burning  /numbness   Neurological: Positive for numbness. Negative for vertigo.  Psychiatric/Behavioral: Negative.   All other systems reviewed and are negative.    Allergies  Review of patient's allergies indicates no known allergies.  Home Medications   Current Outpatient Rx  Name Route Sig Dispense Refill  . ASPIRIN EC 81 MG PO TBEC Oral Take 81 mg by mouth daily.    . IBUPROFEN 200 MG PO TABS Oral Take 400 mg by mouth every 6 (six) hours as needed. For pain    . LISINOPRIL 10 MG PO TABS Oral Take 10 mg by mouth daily.    Marland Kitchen LISINOPRIL-HYDROCHLOROTHIAZIDE 20-12.5 MG PO TABS Oral Take 1 tablet by mouth daily.    Marland Kitchen METFORMIN HCL 500 MG PO TABS Oral Take 1,000 mg by mouth 2 (two) times daily with a meal.      BP 201/89  Pulse 78  Temp 97.5 F (36.4 C) (Oral)  Resp 16  Ht 5' (1.524 m)  Wt 198 lb (89.812 kg)  BMI 38.67 kg/m2  SpO2 98%  LMP 07/06/2011  Physical Exam  Nursing note and vitals reviewed. Constitutional: She is oriented to person, place, and time. She appears well-developed and well-nourished.  HENT:  Head: Normocephalic and atraumatic.  Eyes: Conjunctivae and EOM are normal. Pupils are equal, round, and reactive to light.  Neck: Normal range of motion. Neck supple.  Cardiovascular:  Normal rate.   Pulmonary/Chest: Effort normal and breath sounds normal. No respiratory distress. She has no rales.  Abdominal: Soft.  Musculoskeletal: Normal range of motion. She exhibits edema. She exhibits no tenderness.       Burning /numbness to bilateral LE  1+ pitting edema.  Neurological: She is alert and oriented to person, place, and time. She has normal reflexes. No cranial nerve deficit. Coordination normal.  Skin: Skin is warm and dry.  Psychiatric: She has a normal mood and affect.    ED Course  Procedures (including critical care time)   Labs Reviewed  CBC   No results found.   No diagnosis found.    MDM  Lower extremity burning/numnbess.  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  Started on neurontin with relief.  Elevate feet.  Keep appointment on the 25th. Increase neurontin dose then if working well.  rx for neurontin.  Return for SOB or chest pain.         Remi Haggard, NP 07/20/11 1553

## 2011-07-26 NOTE — ED Provider Notes (Signed)
Medical screening examination/treatment/procedure(s) were performed by non-physician practitioner and as supervising physician I was immediately available for consultation/collaboration.  Ladarian Bonczek, MD 07/26/11 0021 

## 2011-08-29 ENCOUNTER — Encounter (HOSPITAL_COMMUNITY): Payer: Self-pay

## 2011-08-29 ENCOUNTER — Emergency Department (HOSPITAL_COMMUNITY)
Admission: EM | Admit: 2011-08-29 | Discharge: 2011-08-29 | Disposition: A | Discharge status: Home / Self Care | Payer: Self-pay | Attending: Emergency Medicine | Admitting: Emergency Medicine

## 2011-08-29 DIAGNOSIS — E119 Type 2 diabetes mellitus without complications: Secondary | ICD-10-CM | POA: Insufficient documentation

## 2011-08-29 DIAGNOSIS — I10 Essential (primary) hypertension: Secondary | ICD-10-CM | POA: Insufficient documentation

## 2011-08-29 DIAGNOSIS — Z833 Family history of diabetes mellitus: Secondary | ICD-10-CM | POA: Insufficient documentation

## 2011-08-29 DIAGNOSIS — N898 Other specified noninflammatory disorders of vagina: Secondary | ICD-10-CM | POA: Insufficient documentation

## 2011-08-29 DIAGNOSIS — Z7982 Long term (current) use of aspirin: Secondary | ICD-10-CM | POA: Insufficient documentation

## 2011-08-29 DIAGNOSIS — Z8249 Family history of ischemic heart disease and other diseases of the circulatory system: Secondary | ICD-10-CM | POA: Insufficient documentation

## 2011-08-29 LAB — URINALYSIS, ROUTINE W REFLEX MICROSCOPIC
Bilirubin Urine: NEGATIVE
Glucose, UA: NEGATIVE mg/dL
Hgb urine dipstick: NEGATIVE
Ketones, ur: NEGATIVE mg/dL
Nitrite: NEGATIVE
Specific Gravity, Urine: 1.021 (ref 1.005–1.030)
pH: 6 (ref 5.0–8.0)

## 2011-08-29 LAB — WET PREP, GENITAL
Clue Cells Wet Prep HPF POC: NONE SEEN
Trich, Wet Prep: NONE SEEN
Yeast Wet Prep HPF POC: NONE SEEN

## 2011-08-29 NOTE — ED Provider Notes (Signed)
History     CSN: [id number]  Arrival date & time 08/29/11  1222   First MD Initiated Contact with Patient 08/29/11 1550      Chief Complaint  Patient presents with  . Urinary Tract Infection    (Consider location/radiation/quality/duration/timing/severity/associated sxs/prior treatment) HPI Comments: Patient presents with concerns she may have a uti.  She says "it stinks down there".  She denies dysuria, discharge, fever, or abdominal pain.  She says her significant other died five years ago and she has not been sexually active since that time.    Patient is a 51 y.o. female presenting with urinary tract infection. The history is provided by the patient.  Urinary Tract Infection This is a new problem. The current episode started 2 days ago. The problem occurs constantly. The problem has been gradually worsening. Pertinent negatives include no abdominal pain. Nothing aggravates the symptoms. Nothing relieves the symptoms. She has tried nothing for the symptoms.    Past Medical History  Diagnosis Date  . Hypertension   . Diabetes mellitus     Past Surgical History  Procedure Date  . Brain surgery     States they went through her stomach to repair  bleed in her head in '05    Family History  Problem Relation Age of Onset  . Hypertension Mother   . Heart failure Mother   . Diabetes Father     History  Substance Use Topics  . Smoking status: Never Smoker   . Smokeless tobacco: Not on file  . Alcohol Use: No    OB History    Grav Para Term Preterm Abortions TAB SAB Ect Mult Living                  Review of Systems  Gastrointestinal: Negative for abdominal pain.  All other systems reviewed and are negative.    Allergies  Review of patient's allergies indicates no known allergies.  Home Medications   Current Outpatient Rx  Name Route Sig Dispense Refill  . AMLODIPINE BESYLATE 2.5 MG PO TABS Oral Take 2.5 mg by mouth daily.    . ASPIRIN EC 81 MG PO TBEC  Oral Take 81 mg by mouth daily.    . ATORVASTATIN CALCIUM 40 MG PO TABS Oral Take 40 mg by mouth daily.    Marland Kitchen GLIMEPIRIDE 2 MG PO TABS Oral Take 2 mg by mouth daily before breakfast.    . GLIMEPIRIDE 4 MG PO TABS Oral Take 4 mg by mouth daily before breakfast.    . LISINOPRIL-HYDROCHLOROTHIAZIDE 20-12.5 MG PO TABS Oral Take 2 tablets by mouth daily.     Marland Kitchen METFORMIN HCL 1000 MG PO TABS Oral Take 1,000 mg by mouth 2 (two) times daily with a meal.    . METFORMIN HCL 500 MG PO TABS Oral Take 1,000 mg by mouth 2 (two) times daily with a meal.      BP 165/75  Pulse 66  Temp 98.1 F (36.7 C) (Oral)  Resp 16  SpO2 100%  LMP 07/29/2011  Physical Exam  Nursing note and vitals reviewed. Constitutional: She is oriented to person, place, and time. She appears well-developed and well-nourished. No distress.  HENT:  Head: Normocephalic and atraumatic.  Neck: Normal range of motion. Neck supple.  Cardiovascular: Normal rate and regular rhythm.   No murmur heard. Pulmonary/Chest: Effort normal and breath sounds normal. No respiratory distress. She has no wheezes.  Abdominal: Soft. Bowel sounds are normal. She exhibits no distension. There is  no tenderness.  Genitourinary: Vagina normal and uterus normal.       There is dark bloody discharge.   The cervix appears normal as does the external genitalia.   There are some hemorrhoids noted.  Musculoskeletal: Normal range of motion. She exhibits no edema.  Neurological: She is alert and oriented to person, place, and time.  Skin: Skin is warm and dry. She is not diaphoretic.    ED Course  Procedures (including critical care time)   Labs Reviewed  URINALYSIS, ROUTINE W REFLEX MICROSCOPIC  POCT PREGNANCY, URINE   No results found.   No diagnosis found.    MDM  The pelvic exam only shows dark period-like blood, but no discharge.  The wet prep does not show yeast, wbc's, or other abnormalities.  She will be discharged to home with prn  follow up.          Geoffery Lyons, MD 08/29/11 [phone]

## 2011-08-29 NOTE — ED Notes (Signed)
Pt reports "bladder odor" x2 weeks, pt reports she first thought it was a yeast infection, treated w/OTC monistat w/no relief. Pt denies vaginal d/c or pain.

## 2011-08-30 LAB — GC/CHLAMYDIA PROBE AMP, GENITAL
Chlamydia, DNA Probe: NEGATIVE
GC Probe Amp, Genital: NEGATIVE

## 2011-09-06 ENCOUNTER — Encounter (HOSPITAL_COMMUNITY): Payer: Self-pay

## 2011-09-06 ENCOUNTER — Inpatient Hospital Stay (HOSPITAL_COMMUNITY)
Admission: AD | Admit: 2011-09-06 | Discharge: 2011-09-06 | Disposition: A | Discharge status: Home / Self Care | Payer: Self-pay | Source: Ambulatory Visit | Attending: Family Medicine | Admitting: Family Medicine

## 2011-09-06 DIAGNOSIS — N949 Unspecified condition associated with female genital organs and menstrual cycle: Secondary | ICD-10-CM | POA: Insufficient documentation

## 2011-09-06 DIAGNOSIS — K649 Unspecified hemorrhoids: Secondary | ICD-10-CM | POA: Insufficient documentation

## 2011-09-06 DIAGNOSIS — A499 Bacterial infection, unspecified: Secondary | ICD-10-CM

## 2011-09-06 DIAGNOSIS — K5900 Constipation, unspecified: Secondary | ICD-10-CM

## 2011-09-06 DIAGNOSIS — I10 Essential (primary) hypertension: Secondary | ICD-10-CM | POA: Insufficient documentation

## 2011-09-06 DIAGNOSIS — B9689 Other specified bacterial agents as the cause of diseases classified elsewhere: Secondary | ICD-10-CM

## 2011-09-06 DIAGNOSIS — N760 Acute vaginitis: Secondary | ICD-10-CM

## 2011-09-06 HISTORY — DX: Cerebral aneurysm, nonruptured: I67.1

## 2011-09-06 HISTORY — DX: Pure hypercholesterolemia, unspecified: E78.00

## 2011-09-06 LAB — WET PREP, GENITAL
WBC, Wet Prep HPF POC: NONE SEEN
Yeast Wet Prep HPF POC: NONE SEEN

## 2011-09-06 LAB — URINALYSIS, ROUTINE W REFLEX MICROSCOPIC
Glucose, UA: NEGATIVE mg/dL
Leukocytes, UA: NEGATIVE
Protein, ur: NEGATIVE mg/dL

## 2011-09-06 MED ORDER — METRONIDAZOLE 0.75 % VA GEL: 1.0000 | Freq: Every day | VAGINAL | Status: AC

## 2011-09-06 NOTE — MAU Note (Addendum)
Pt states she has vaginal discharge that has foul odor. Has noticed odor for "a while". Denies pain. Denies abnormal vaginal bleeding. Also has hemorroid that has what feels like a knot inside her "that is not supposed to be there".

## 2011-09-06 NOTE — MAU Note (Signed)
I have foul odor in my vagina. It's not my period. Noticed this for 3days. Last time I had this was bladder infection.

## 2011-09-06 NOTE — MAU Provider Note (Signed)
History    CSN: [id number]  Arrival date and time: 09/06/11 1901   First Provider Initiated Contact with Patient 09/06/11 2115      Chief Complaint  Patient presents with  . Vaginal Discharge   HPI Pt is a 51 y.o. G0P0 who presents with recurrent fishy vaginal odor. Seen at [facility] 8/22 with negative GC, wet prep. Had just finished menses at that time. Still feels that she has bad odor vaginally but minimal discharge. No fever/chills/dysuria. Used to self-treat odor with Monistat but last time was in May. Does take long baths with bubblebath or other scented soap. Does not douche. She states she does not have a current sexual partner and has not been sexually active since 2009. Does not know when her last pap smear was.  Also c/o hemorrhoids for long time. Recently noticed a "knot" inside rectum, tender initially but not now. Hx constipation off and on since age 48. Does state she sees blood in stool occasionally - bright red blood on TP as well as flecks mixed in stool. Has several people in family with cancer but does not know what type.  Pt was going to [facility] but does not currently have a PCP since [facility] closed. She has DM, HTN.hyperlipidemia.   Past Medical History  Diagnosis Date  . Hypertension   . Diabetes mellitus   . Hypercholesteremia   . Brain aneurysm     Past Surgical History  Procedure Date  . Brain surgery     States they went through her stomach to repair  bleed in her head in '05  . Cerebral aneurysm repair     in 2005    Family History  Problem Relation Age of Onset  . Hypertension Mother   . Heart failure Mother   . Diabetes Father   . Other Neg Hx     History  Substance Use Topics  . Smoking status: Never Smoker   . Smokeless tobacco: Never Used  . Alcohol Use: No    Allergies: No Known Allergies  Prescriptions prior to admission  Medication Sig Dispense Refill  . amLODipine (NORVASC) 2.5 MG tablet Take 2.5 mg by mouth  daily.      Marland Kitchen aspirin EC 81 MG tablet Take 81 mg by mouth daily.      Marland Kitchen atorvastatin (LIPITOR) 40 MG tablet Take 40 mg by mouth daily.      Marland Kitchen glimepiride (AMARYL) 2 MG tablet Take 2 mg by mouth daily before breakfast.      . glimepiride (AMARYL) 4 MG tablet Take 4 mg by mouth daily before breakfast.      . lisinopril-hydrochlorothiazide (PRINZIDE,ZESTORETIC) 20-12.5 MG per tablet Take 2 tablets by mouth daily.       . metFORMIN (GLUCOPHAGE) 1000 MG tablet Take 1,000 mg by mouth 2 (two) times daily with a meal.      . metFORMIN (GLUCOPHAGE) 500 MG tablet Take 1,000 mg by mouth 2 (two) times daily with a meal.        Review of Systems  Constitutional: Negative for fever and chills.  Respiratory: Negative for cough and shortness of breath.   Cardiovascular: Negative for chest pain and palpitations.  Gastrointestinal: Positive for constipation and blood in stool. Negative for nausea, vomiting, abdominal pain and diarrhea.  Genitourinary: Negative for dysuria, urgency and frequency.  Neurological: Negative for dizziness and headaches.   Physical Exam   Blood pressure 178/76, pulse 72, temperature 98.3 F (36.8 C), resp. rate 20,  height 5\' 1"  (1.549 m), weight 87.635 kg (193 lb 3.2 oz), last menstrual period 08/29/2011.  Physical Exam  Constitutional: She is oriented to person, place, and time. She appears well-developed and well-nourished. No distress.  HENT:  Head: Normocephalic and atraumatic.  Eyes: Conjunctivae and EOM are normal.  Neck: Normal range of motion. Neck supple.  Cardiovascular: Normal rate and regular rhythm.   Respiratory: Effort normal. No respiratory distress.  GI: Soft. Bowel sounds are normal. She exhibits no distension and no mass. There is no tenderness. There is no rebound and no guarding.  Genitourinary: Uterus normal. Rectal exam shows external hemorrhoid (Large (1 to 1.5 cm) non-tender thick hemorrhoid sack at 6:00. Other skin tags present are soft, thin.) and  anal tone abnormal. Rectal exam shows no internal hemorrhoid, no fissure, no mass (Two small (0.5 cm or less) soft papules palpated approximately 2 cm from sphincte at between 7 and 9:00. Non-tender. ) and no tenderness. Guaiac negative stool (No stool felt in rectum.). There is no rash, tenderness or lesion on the right labia. There is no rash, tenderness or lesion on the left labia. Uterus is not deviated, not enlarged, not fixed and not tender. Cervix exhibits no motion tenderness, no discharge and no friability. Right adnexum displays no mass, no tenderness and no fullness. Left adnexum displays no mass, no tenderness and no fullness. No erythema, tenderness or bleeding around the vagina. No foreign body around the vagina. Vaginal discharge (Minimal, white mucous) found.  Neurological: She is alert and oriented to person, place, and time.  Skin: Skin is warm and dry.  Psychiatric: She has a normal mood and affect.       Slow mentation.    MAU Course  Procedures  Assessment and Plan  51 y.o. G0P0 with  1.  Vaginal odor - negative wet prep. Will give pt information on BV since this seems to fit description. Will give pt Rx for metronidazole vaginal gel to fill if odor returns. 2.  Hemorrhoids - Will give hydrocortisone cream. Internal papules may be polyps. Pt needs to establish care and have colonoscopy. Hemoccult not available in house. Hydrocortisone cream. 3.  HTN - 178/76 --> 161/76.  Pt has not taken tonight's dose of BP medicine. Asymptomatic. Will have pt take home meds and recheck prior to discharge.    Napoleon Form 09/06/2011, 9:17 PM

## 2011-09-09 ENCOUNTER — Telehealth (HOSPITAL_COMMUNITY): Payer: Self-pay | Admitting: *Deleted

## 2011-09-09 NOTE — ED Notes (Signed)
Pt. called on VM and said she needs to come to get her bladder checked and states her vagina smells.  " I have an awful body odor."  I called pt. back and she said she went to [facility]. on 8/30 and was treated for bacterial vaginosis.  She said they told her she had a lump near her rectum and needs to be checked for colon CA. She asked if she should come here.  She used to go to [facility]. I told her we don't do that here. I suggested she call [facility] and tell them what she told me and see if she can make an appointment. Vassie Moselle 09/09/2011

## 2011-09-16 ENCOUNTER — Telehealth (HOSPITAL_COMMUNITY): Payer: Self-pay | Admitting: *Deleted

## 2011-09-16 NOTE — ED Notes (Signed)
I called [facility] and they said they will see the pt. if her orange card is up to date.  I called pt. and left a message telling her this. Vassie Moselle 09/16/2011

## 2011-09-16 NOTE — ED Notes (Signed)
Pt. came to [facility] and said she went to [facility] and they sent her here and told her to have [place] call. We did not see this pt. She was seen at [facility].  Since [facility] closed she does not have a PCP and is trying to f/u up on a lump in her perineum that she thinks in cancer, I suggested she try [facility].  Discussed with Dr. Chaney Malling.  She said to tell her to try GI.  I checked to see who was on call 8/22 and it was [facility]. I gave her the name, address and phone number for her to try.  I will call [facility] to tell them the situation. Vassie Moselle 09/16/2011

## 2011-10-24 ENCOUNTER — Emergency Department (INDEPENDENT_AMBULATORY_CARE_PROVIDER_SITE_OTHER): Payer: Self-pay

## 2011-10-24 ENCOUNTER — Emergency Department (INDEPENDENT_AMBULATORY_CARE_PROVIDER_SITE_OTHER)
Admission: EM | Admit: 2011-10-24 | Discharge: 2011-10-24 | Disposition: A | Discharge status: Home / Self Care | Payer: Self-pay | Source: Home / Self Care | Attending: Emergency Medicine | Admitting: Emergency Medicine

## 2011-10-24 ENCOUNTER — Encounter (HOSPITAL_COMMUNITY): Payer: Self-pay | Admitting: *Deleted

## 2011-10-24 DIAGNOSIS — I10 Essential (primary) hypertension: Secondary | ICD-10-CM

## 2011-10-24 DIAGNOSIS — M79609 Pain in unspecified limb: Secondary | ICD-10-CM

## 2011-10-24 DIAGNOSIS — M79672 Pain in left foot: Secondary | ICD-10-CM

## 2011-10-24 LAB — POCT I-STAT, CHEM 8
BUN: 12 mg/dL (ref 6–23)
Calcium, Ion: 1.18 mmol/L (ref 1.12–1.23)
Creatinine, Ser: 1 mg/dL (ref 0.50–1.10)
Glucose, Bld: 102 mg/dL — ABNORMAL HIGH (ref 70–99)
TCO2: 25 mmol/L (ref 0–100)

## 2011-10-24 MED ORDER — ATORVASTATIN CALCIUM 40 MG PO TABS: 40.0000 mg | ORAL_TABLET | Freq: Every evening | ORAL | Status: DC

## 2011-10-24 MED ORDER — METFORMIN HCL 1000 MG PO TABS: 1000.0000 mg | ORAL_TABLET | Freq: Two times a day (BID) | ORAL | Status: DC

## 2011-10-24 MED ORDER — AMLODIPINE BESYLATE 2.5 MG PO TABS: 2.5000 mg | ORAL_TABLET | Freq: Every day | ORAL | Status: DC

## 2011-10-24 MED ORDER — LISINOPRIL-HYDROCHLOROTHIAZIDE 20-12.5 MG PO TABS: 2.0000 | ORAL_TABLET | Freq: Every day | ORAL | Status: DC

## 2011-10-24 MED ORDER — GLIMEPIRIDE 4 MG PO TABS: 4.0000 mg | ORAL_TABLET | Freq: Every day | ORAL | Status: DC

## 2011-10-24 MED ORDER — ASPIRIN EC 81 MG PO TBEC: 81.0000 mg | DELAYED_RELEASE_TABLET | Freq: Every day | ORAL | Status: DC

## 2011-10-24 NOTE — ED Provider Notes (Signed)
History     CSN: [id number]  Arrival date & time 10/24/11  1510   First MD Initiated Contact with Patient 10/24/11 1635      Chief Complaint  Patient presents with  . Foot Pain  . Hypertension    (Consider location/radiation/quality/duration/timing/severity/associated sxs/prior treatment) HPI patient reports that 2 weeks ago she ran out of her antihypertensive medications. [facility] and has not found a new primary care provider. She denies any chest pain, shortness of breath, visual changes, or lower extremity swelling. She denies any nausea, vomiting, or diarrhea.  Additionally, the patient stepped on a rusty nail 2 months ago. At that time she had some pain in the area of injury. The area of injury was in the center of the left foot. This has since healed and is no longer painful. Since that time, however, she has had increased pain in an area where her she reports a deformity that has been present since her teenage years. The area is painful but is not warm and there are has not been any redness in her foot. She is not having any fevers or chills, nausea, vomiting, or diarrhea.  Past Medical History  Diagnosis Date  . Hypertension   . Diabetes mellitus   . Hypercholesteremia   . Brain aneurysm     Past Surgical History  Procedure Date  . Brain surgery     States they went through her stomach to repair  bleed in her head in '05  . Cerebral aneurysm repair     in 2005    Family History  Problem Relation Age of Onset  . Hypertension Mother   . Heart failure Mother   . Diabetes Father   . Other Neg Hx     History  Substance Use Topics  . Smoking status: Never Smoker   . Smokeless tobacco: Never Used  . Alcohol Use: No    OB History    Grav Para Term Preterm Abortions TAB SAB Ect Mult Living   0               Review of Systems per history of present illness  Allergies  Review of patient's allergies indicates no known  allergies.  Home Medications   Current Outpatient Rx  Name Route Sig Dispense Refill  . AMLODIPINE BESYLATE 2.5 MG PO TABS Oral Take 1 tablet (2.5 mg total) by mouth daily. 30 tablet 1  . ASPIRIN EC 81 MG PO TBEC Oral Take 1 tablet (81 mg total) by mouth daily. 30 tablet 1  . ATORVASTATIN CALCIUM 40 MG PO TABS Oral Take 1 tablet (40 mg total) by mouth every evening. 30 tablet 1  . GLIMEPIRIDE 4 MG PO TABS Oral Take 1 tablet (4 mg total) by mouth daily before breakfast. 30 tablet 1  . LISINOPRIL-HYDROCHLOROTHIAZIDE 20-12.5 MG PO TABS Oral Take 2 tablets by mouth daily. 30 tablet 1  . METFORMIN HCL 1000 MG PO TABS Oral Take 1 tablet (1,000 mg total) by mouth 2 (two) times daily with a meal. 60 tablet 1    BP 188/84  Pulse 66  Temp 98.5 F (36.9 C) (Oral)  Resp 32  SpO2 100%  LMP 10/12/2011  Physical Exam  Constitutional: She is oriented to person, place, and time. She appears well-developed and well-nourished. No distress.  HENT:  Head: Normocephalic and atraumatic.  Right Ear: External ear normal.  Left Ear: External ear normal.  Eyes: Conjunctivae normal and EOM are  normal.       No papilledema noted  Neck: Normal range of motion. Neck supple.  Cardiovascular: Normal rate and normal heart sounds.   Pulmonary/Chest: Effort normal and breath sounds normal.  Abdominal: Soft. There is no tenderness.  Musculoskeletal: Normal range of motion.       Feet:  Lymphadenopathy:    She has no cervical adenopathy.  Neurological: She is alert and oriented to person, place, and time. No cranial nerve deficit. Coordination normal.  Skin: Skin is warm and dry.  Psychiatric: She has a normal mood and affect. Her behavior is normal. Thought content normal.    ED Course  Procedures (including critical care time)  Labs Reviewed  POCT I-STAT, CHEM 8 - Abnormal; Notable for the following:    Glucose, Bld 102 (*)     All other components within normal limits   Dg Foot Complete  Left  10/24/2011  *RADIOLOGY REPORT*  Clinical Data: Stepped on nail.  Foot pain  LEFT FOOT - COMPLETE 3+ VIEW  Comparison: None.  Findings: No evidence of fracture or dislocation.  No evidence of radiopaque foreign body. A small degenerative spurs are seen involving the navicular bone and the calcaneus.  IMPRESSION:  No evidence of fracture or radiopaque foreign body.   Original Report Authenticated By: Danae Orleans, M.D.      1. Hypertension   2. Foot pain, left       MDM  The patient has no signs or symptoms of a malignant hypertension or hypertensive urgency. I have stressed the importance of her antihypertensive medications and finding a new primary care provider with her. I will provide her with refills on all of her medications with a one-month supply plus one refill. Patient was advised flags that should prompt return to the emergency room.  For the patient's left foot pain, there is no bony abnormality. There is no evidence of infection. She reports a similar lesion has been present since her teenage years. X-ray does not show any obvious bony deformity and merely reveal some soft tissue swelling. Will provide the patient with a postop shoe to help offload this area is somewhat and will recommend followup with a primary care provider. One she has reviewed her orange card it would be good for her to find an orthopedist to see her.        Brent Bulla, MD 10/24/11 [phone]

## 2011-10-24 NOTE — ED Notes (Signed)
Pt reports no htn med for 2 weeks. Left foot pain for the past 2 months after stepping on rusty nail - warm soaks no help

## 2011-10-25 NOTE — ED Provider Notes (Signed)
Medical screening examination/treatment/procedure(s) were performed by a resident physician and as supervising physician I was immediately available for consultation/collaboration.  Additionally, I saw the patient independently, verified the history, examined the patient and discussed the treatment plan with the resident.  Kinser Fellman, M.D.    Jossalyn Forgione C Tery Hoeger, MD 10/25/11 0141 

## 2012-04-25 ENCOUNTER — Emergency Department (HOSPITAL_COMMUNITY)
Admission: EM | Admit: 2012-04-25 | Discharge: 2012-04-25 | Disposition: A | Discharge status: Home / Self Care | Payer: Self-pay | Attending: Emergency Medicine | Admitting: Emergency Medicine

## 2012-04-25 ENCOUNTER — Encounter (HOSPITAL_COMMUNITY): Payer: Self-pay | Admitting: Physical Medicine and Rehabilitation

## 2012-04-25 DIAGNOSIS — Z3202 Encounter for pregnancy test, result negative: Secondary | ICD-10-CM | POA: Insufficient documentation

## 2012-04-25 DIAGNOSIS — E119 Type 2 diabetes mellitus without complications: Secondary | ICD-10-CM | POA: Insufficient documentation

## 2012-04-25 DIAGNOSIS — Z79899 Other long term (current) drug therapy: Secondary | ICD-10-CM | POA: Insufficient documentation

## 2012-04-25 DIAGNOSIS — N9489 Other specified conditions associated with female genital organs and menstrual cycle: Secondary | ICD-10-CM | POA: Insufficient documentation

## 2012-04-25 DIAGNOSIS — I10 Essential (primary) hypertension: Secondary | ICD-10-CM | POA: Insufficient documentation

## 2012-04-25 DIAGNOSIS — Z7982 Long term (current) use of aspirin: Secondary | ICD-10-CM | POA: Insufficient documentation

## 2012-04-25 DIAGNOSIS — N899 Noninflammatory disorder of vagina, unspecified: Secondary | ICD-10-CM | POA: Insufficient documentation

## 2012-04-25 DIAGNOSIS — Z8669 Personal history of other diseases of the nervous system and sense organs: Secondary | ICD-10-CM | POA: Insufficient documentation

## 2012-04-25 DIAGNOSIS — E7800 Pure hypercholesterolemia, unspecified: Secondary | ICD-10-CM | POA: Insufficient documentation

## 2012-04-25 DIAGNOSIS — N898 Other specified noninflammatory disorders of vagina: Secondary | ICD-10-CM

## 2012-04-25 LAB — URINALYSIS, ROUTINE W REFLEX MICROSCOPIC
Glucose, UA: NEGATIVE mg/dL
Leukocytes, UA: NEGATIVE
Protein, ur: NEGATIVE mg/dL
Specific Gravity, Urine: 1.016 (ref 1.005–1.030)
Urobilinogen, UA: 0.2 mg/dL (ref 0.0–1.0)

## 2012-04-25 LAB — POCT PREGNANCY, URINE: Preg Test, Ur: NEGATIVE

## 2012-04-25 LAB — WET PREP, GENITAL: Trich, Wet Prep: NONE SEEN

## 2012-04-25 MED ORDER — LISINOPRIL-HYDROCHLOROTHIAZIDE 20-12.5 MG PO TABS: 2.0000 | ORAL_TABLET | Freq: Every day | ORAL | Status: DC

## 2012-04-25 MED ORDER — FLUCONAZOLE 100 MG PO TABS
150.0000 mg | ORAL_TABLET | Freq: Once | ORAL | Status: AC
  Administered 2012-04-25: 150 mg via ORAL
  Filled 2012-04-25: qty 2

## 2012-04-25 NOTE — ED Notes (Signed)
Pt. Given prescription for BP medicine. Educated on follow-up to regulate BP.

## 2012-04-25 NOTE — ED Notes (Signed)
Pt presents to department for evaluation of vaginal itching and foul odor. Ongoing x3 days. Denies vaginal bleeding/discharge. Denies abdominal pain. Pt is conscious alert and oriented x4.

## 2012-04-25 NOTE — ED Provider Notes (Signed)
History     CSN: [id number]  Arrival date & time 04/25/12  1825   First MD Initiated Contact with Patient 04/25/12 1842      Chief Complaint  Patient presents with  . Vaginal Itching    (Consider location/radiation/quality/duration/timing/severity/associated sxs/prior treatment) HPI 52 year old female presents emergency Department with chief complaint of vaginal itching and felt odor.  The patient states this has been going on for the past 3 days.  She has a past medical history of diabetes, hypertension, hypercholesterolemia and previous brain aneurysm.  The patient denies any recent sexual intercourse or unprotected sex.  Patient denies any vaginal discharge, bleeding, pelvic pain. Denies fevers, chills, myalgias, arthralgias. Denies DOE, SOB, chest tightness or pressure, radiation to left arm, jaw or back, or diaphoresis. Denies dysuria, flank pain, suprapubic pain, frequency, urgency, or hematuria. Denies headaches, light headedness, weakness, visual disturbances. Denies abdominal pain, nausea, vomiting, diarrhea or constipation.    Past Medical History  Diagnosis Date  . Hypertension   . Diabetes mellitus   . Hypercholesteremia   . Brain aneurysm     Past Surgical History  Procedure Laterality Date  . Brain surgery      States they went through her stomach to repair  bleed in her head in '05  . Cerebral aneurysm repair      in 2005    Family History  Problem Relation Age of Onset  . Hypertension Mother   . Heart failure Mother   . Diabetes Father   . Other Neg Hx     History  Substance Use Topics  . Smoking status: Never Smoker   . Smokeless tobacco: Never Used  . Alcohol Use: No    OB History   Grav Para Term Preterm Abortions TAB SAB Ect Mult Living   0               Review of Systems Ten systems reviewed and are negative for acute change, except as noted in the HPI.   Allergies  Review of patient's allergies indicates no known allergies.  Home  Medications   Current Outpatient Rx  Name  Route  Sig  Dispense  Refill  . aspirin EC 81 MG tablet   Oral   Take 81 mg by mouth daily.         Marland Kitchen glimepiride (AMARYL) 4 MG tablet   Oral   Take 4 mg by mouth daily before breakfast.         . lisinopril-hydrochlorothiazide (PRINZIDE,ZESTORETIC) 20-12.5 MG per tablet   Oral   Take 2 tablets by mouth daily.         . metFORMIN (GLUCOPHAGE) 500 MG tablet   Oral   Take 1,000 mg by mouth 2 (two) times daily with a meal.           BP 193/83  Pulse 70  Temp(Src) 98.4 F (36.9 C) (Oral)  Resp 20  SpO2 99%  Physical Exam  Vitals reviewed. Constitutional: She is oriented to person, place, and time. She appears well-developed and well-nourished. No distress.  HENT:  Head: Normocephalic and atraumatic.  Eyes: Conjunctivae are normal. No scleral icterus.  Neck: Normal range of motion.  Cardiovascular: Normal rate, regular rhythm and normal heart sounds.  Exam reveals no gallop and no friction rub.   No murmur heard. Pulmonary/Chest: Effort normal and breath sounds normal. No respiratory distress.  Abdominal: Soft. Bowel sounds are normal. She exhibits no distension and no mass. There is no tenderness. There is no  guarding.  Musculoskeletal:  Pelvic exam: normal external genitalia, vulva, vagina, cervix, uterus and adnexa, VULVA: normal appearing vulva with no masses, tenderness or lesions, VAGINA: normal appearing vagina with normal color and discharge, no lesions, CERVIX: normal appearing cervix without discharge or lesions. No CMT, mass, fullness or pain with bimanual exam   Neurological: She is alert and oriented to person, place, and time.  Skin: Skin is warm and dry. She is not diaphoretic.    ED Course  Procedures (including critical care time)  Labs Reviewed  WET PREP, GENITAL  GC/CHLAMYDIA PROBE AMP  URINALYSIS, ROUTINE W REFLEX MICROSCOPIC  POCT PREGNANCY, URINE   No results found.   1. Vaginal itching    2. Vaginal odor       MDM  8:46 PM Patient without sign of infection.  SHe does have rf for yeast infection due to her diabetes.   Wil treat once with oral diflucan and have the patient follow up with [facility].  Pt not concerning for PID because hemodynamically stable and no cervical motion tenderness on pelvic exam.       Arthor Captain, PA-C 04/25/12 2057

## 2012-04-26 NOTE — ED Provider Notes (Signed)
Medical screening examination/treatment/procedure(s) were performed by non-physician practitioner and as supervising physician I was immediately available for consultation/collaboration.   Owen Pagnotta III, MD 04/26/12 1358 

## 2012-06-05 ENCOUNTER — Other Ambulatory Visit: Payer: Self-pay | Admitting: Family Medicine

## 2012-07-23 ENCOUNTER — Other Ambulatory Visit: Payer: Self-pay | Admitting: Family Medicine

## 2012-11-12 ENCOUNTER — Other Ambulatory Visit: Payer: Self-pay

## 2012-11-26 ENCOUNTER — Emergency Department (HOSPITAL_COMMUNITY)
Admission: EM | Admit: 2012-11-26 | Discharge: 2012-11-26 | Disposition: A | Discharge status: Home / Self Care | Payer: Self-pay | Attending: Emergency Medicine | Admitting: Emergency Medicine

## 2012-11-26 ENCOUNTER — Encounter (HOSPITAL_COMMUNITY): Payer: Self-pay | Admitting: Emergency Medicine

## 2012-11-26 ENCOUNTER — Emergency Department (HOSPITAL_COMMUNITY): Payer: Self-pay

## 2012-11-26 DIAGNOSIS — Y939 Activity, unspecified: Secondary | ICD-10-CM | POA: Insufficient documentation

## 2012-11-26 DIAGNOSIS — Y929 Unspecified place or not applicable: Secondary | ICD-10-CM | POA: Insufficient documentation

## 2012-11-26 DIAGNOSIS — S8990XA Unspecified injury of unspecified lower leg, initial encounter: Secondary | ICD-10-CM | POA: Insufficient documentation

## 2012-11-26 DIAGNOSIS — I10 Essential (primary) hypertension: Secondary | ICD-10-CM | POA: Insufficient documentation

## 2012-11-26 DIAGNOSIS — E119 Type 2 diabetes mellitus without complications: Secondary | ICD-10-CM | POA: Insufficient documentation

## 2012-11-26 DIAGNOSIS — R296 Repeated falls: Secondary | ICD-10-CM | POA: Insufficient documentation

## 2012-11-26 DIAGNOSIS — Z79899 Other long term (current) drug therapy: Secondary | ICD-10-CM | POA: Insufficient documentation

## 2012-11-26 DIAGNOSIS — Z7982 Long term (current) use of aspirin: Secondary | ICD-10-CM | POA: Insufficient documentation

## 2012-11-26 DIAGNOSIS — M79662 Pain in left lower leg: Secondary | ICD-10-CM

## 2012-11-26 MED ORDER — METFORMIN HCL 500 MG PO TABS: 500.0000 mg | ORAL_TABLET | Freq: Two times a day (BID) | ORAL | Status: DC

## 2012-11-26 MED ORDER — LISINOPRIL-HYDROCHLOROTHIAZIDE 20-12.5 MG PO TABS: 1.0000 | ORAL_TABLET | Freq: Two times a day (BID) | ORAL | Status: DC

## 2012-11-26 MED ORDER — OXYCODONE-ACETAMINOPHEN 5-325 MG PO TABS
1.0000 | ORAL_TABLET | Freq: Once | ORAL | Status: AC
  Administered 2012-11-26: 1 via ORAL
  Filled 2012-11-26: qty 1

## 2012-11-26 MED ORDER — HYDROCODONE-ACETAMINOPHEN 5-325 MG PO TABS: 1.0000 | ORAL_TABLET | ORAL | Status: DC | PRN

## 2012-11-26 NOTE — ED Notes (Signed)
Pt reports falling this am, "felt something pop to back of her left leg." having swelling to lower leg. bp 200/89 at triage. Also states she wants her bladder checked for an infection. No acute distress noted at triage.

## 2012-11-26 NOTE — ED Provider Notes (Signed)
CSN: [id number]     Arrival date & time 11/26/12  1830 History   First MD Initiated Contact with Patient 11/26/12 2109     Chief Complaint  Patient presents with  . Fall  . Leg Pain    HPI Patient reports falling this morning and falling forward and feeling something pop in her left calf.  She states ongoing pain in her left calf since the fall.  She denies pain with range of motion of her left knee.  No pain with range of motion of her left ankle.  She denies weakness of her left lower extremity.  No abdominal pain or hip pain.  Her symptoms are mild in severity.  Her pain is worsened by palpation of her left calf.   Past Medical History  Diagnosis Date  . Hypertension   . Diabetes mellitus   . Hypercholesteremia   . Brain aneurysm    Past Surgical History  Procedure Laterality Date  . Brain surgery      States they went through her stomach to repair  bleed in her head in '05  . Cerebral aneurysm repair      in 2005   Family History  Problem Relation Age of Onset  . Hypertension Mother   . Heart failure Mother   . Diabetes Father   . Other Neg Hx    History  Substance Use Topics  . Smoking status: Never Smoker   . Smokeless tobacco: Never Used  . Alcohol Use: No   OB History   Grav Para Term Preterm Abortions TAB SAB Ect Mult Living   0              Review of Systems  All other systems reviewed and are negative.    Allergies  Review of patient's allergies indicates no known allergies.  Home Medications   Current Outpatient Rx  Name  Route  Sig  Dispense  Refill  . aspirin EC 81 MG tablet   Oral   Take 81 mg by mouth See admin instructions. Take 1 tablet (81 mg) daily, take an additional 1 tablet daily if needed for pain         . HYDROcodone-acetaminophen (NORCO/VICODIN) 5-325 MG per tablet   Oral   Take 1 tablet by mouth every 4 (four) hours as needed for moderate pain.   15 tablet   0   . lisinopril-hydrochlorothiazide (PRINZIDE,ZESTORETIC)  20-12.5 MG per tablet   Oral   Take 1 tablet by mouth 2 (two) times daily.   60 tablet   3   . metFORMIN (GLUCOPHAGE) 500 MG tablet   Oral   Take 1 tablet (500 mg total) by mouth 2 (two) times daily with a meal.   60 tablet   3    BP 200/89  Pulse 89  Temp(Src) 98.3 F (36.8 C) (Oral)  Resp 18  Ht 5' (1.524 m)  Wt 198 lb (89.812 kg)  BMI 38.67 kg/m2  SpO2 99%  LMP 11/11/2012 Physical Exam  Nursing note and vitals reviewed. Constitutional: She is oriented to person, place, and time. She appears well-developed and well-nourished. No distress.  HENT:  Head: Normocephalic and atraumatic.  Eyes: EOM are normal.  Neck: Normal range of motion.  Cardiovascular: Normal rate, regular rhythm and normal heart sounds.   Pulmonary/Chest: Effort normal and breath sounds normal.  Abdominal: Soft. She exhibits no distension. There is no tenderness.  Musculoskeletal: Normal range of motion.  Tenderness of her left calf with some  mild swelling per the patient.  Normal PT and DP pulses the left foot.  Palpation and compression of her left calf results in plantar flexion of her foot.  No crepitus.  No erythema.  No focal tenderness of one area of her posterior calf  Neurological: She is alert and oriented to person, place, and time.  Skin: Skin is warm and dry.  Psychiatric: She has a normal mood and affect. Judgment normal.    ED Course  Procedures (including critical care time) Labs Review Labs Reviewed - No data to display Imaging Review Dg Tibia/fibula Left  11/26/2012   CLINICAL DATA:  Left tibial and fibular pain post fall  EXAM: LEFT TIBIA AND FIBULA - 2 VIEW  COMPARISON:  None  FINDINGS: Osseous mineralization normal.  Joint spaces preserved.  No fracture, dislocation, or bone destruction.  IMPRESSION: Normal exam.   Electronically Signed   By: Ulyses Southward M.D.   On: 11/26/2012 22:17  I personally reviewed the imaging tests through PACS system I reviewed available  ER/hospitalization records through the EMR   EKG Interpretation   None       MDM   1. Calf pain, left    Plain films are normal.  Discharge him with orthopedic followup.  As you may require MRI as outpatient to further delineate the cause of her left calf pain.  Doubt DVT.    Lyanne Co, MD 11/27/12 [phone]

## 2013-09-21 ENCOUNTER — Encounter: Payer: Self-pay | Admitting: Podiatry

## 2013-09-21 ENCOUNTER — Ambulatory Visit: Payer: Self-pay

## 2013-09-22 NOTE — Progress Notes (Signed)
This encounter was created in error - please disregard.

## 2013-10-12 ENCOUNTER — Ambulatory Visit: Payer: Self-pay | Admitting: Podiatry

## 2013-10-26 ENCOUNTER — Emergency Department (HOSPITAL_COMMUNITY)
Admission: EM | Admit: 2013-10-26 | Discharge: 2013-10-26 | Disposition: A | Discharge status: Home / Self Care | Payer: BC Managed Care – PPO | Attending: Emergency Medicine | Admitting: Emergency Medicine

## 2013-10-26 ENCOUNTER — Emergency Department (HOSPITAL_COMMUNITY): Payer: BC Managed Care – PPO

## 2013-10-26 ENCOUNTER — Encounter (HOSPITAL_COMMUNITY): Payer: Self-pay | Admitting: Emergency Medicine

## 2013-10-26 DIAGNOSIS — E119 Type 2 diabetes mellitus without complications: Secondary | ICD-10-CM | POA: Insufficient documentation

## 2013-10-26 DIAGNOSIS — R011 Cardiac murmur, unspecified: Secondary | ICD-10-CM | POA: Diagnosis not present

## 2013-10-26 DIAGNOSIS — J069 Acute upper respiratory infection, unspecified: Secondary | ICD-10-CM | POA: Diagnosis not present

## 2013-10-26 DIAGNOSIS — K644 Residual hemorrhoidal skin tags: Secondary | ICD-10-CM | POA: Diagnosis not present

## 2013-10-26 DIAGNOSIS — M791 Myalgia: Secondary | ICD-10-CM | POA: Diagnosis present

## 2013-10-26 DIAGNOSIS — I10 Essential (primary) hypertension: Secondary | ICD-10-CM | POA: Insufficient documentation

## 2013-10-26 DIAGNOSIS — Z9889 Other specified postprocedural states: Secondary | ICD-10-CM | POA: Insufficient documentation

## 2013-10-26 DIAGNOSIS — Z7982 Long term (current) use of aspirin: Secondary | ICD-10-CM | POA: Diagnosis not present

## 2013-10-26 DIAGNOSIS — Z79899 Other long term (current) drug therapy: Secondary | ICD-10-CM | POA: Diagnosis not present

## 2013-10-26 LAB — I-STAT CHEM 8, ED
BUN: 14 mg/dL (ref 6–23)
CREATININE: 0.9 mg/dL (ref 0.50–1.10)
Calcium, Ion: 1.11 mmol/L — ABNORMAL LOW (ref 1.12–1.23)
Chloride: 99 mEq/L (ref 96–112)
GLUCOSE: 151 mg/dL — AB (ref 70–99)
HCT: 38 % (ref 36.0–46.0)
HEMOGLOBIN: 12.9 g/dL (ref 12.0–15.0)
POTASSIUM: 3.5 meq/L — AB (ref 3.7–5.3)
Sodium: 140 mEq/L (ref 137–147)
TCO2: 27 mmol/L (ref 0–100)

## 2013-10-26 NOTE — ED Provider Notes (Signed)
CSN: [id number]     Arrival date & time 10/26/13  1623 History   First MD Initiated Contact with Patient 10/26/13 2120     Chief Complaint  Patient presents with  . Generalized Body Aches  . Rectal Pain     (Consider location/radiation/quality/duration/timing/severity/associated sxs/prior Treatment) Patient is a 53 y.o. female presenting with URI.  URI Presenting symptoms: congestion, cough, rhinorrhea and sore throat   Presenting symptoms: no fever   Severity:  Moderate Onset quality:  Gradual Duration:  3 days Timing:  Constant Progression:  Unchanged Chronicity:  New Relieved by:  None tried Worsened by:  Nothing tried Ineffective treatments:  None tried Associated symptoms: myalgias and swollen glands   Associated symptoms: no arthralgias, no headaches and no wheezing   Risk factors: no immunosuppression     Past Medical History  Diagnosis Date  . Hypertension   . Diabetes mellitus   . Hypercholesteremia   . Brain aneurysm    Past Surgical History  Procedure Laterality Date  . Brain surgery      States they went through her stomach to repair  bleed in her head in '05  . Cerebral aneurysm repair      in 2005   Family History  Problem Relation Age of Onset  . Hypertension Mother   . Heart failure Mother   . Diabetes Father   . Other Neg Hx    History  Substance Use Topics  . Smoking status: Never Smoker   . Smokeless tobacco: Never Used  . Alcohol Use: No   OB History   Grav Para Term Preterm Abortions TAB SAB Ect Mult Living   0              Review of Systems  Constitutional: Negative for fever and chills.  HENT: Positive for congestion, rhinorrhea and sore throat.   Eyes: Negative for visual disturbance.  Respiratory: Positive for cough. Negative for shortness of breath and wheezing.   Cardiovascular: Negative for chest pain.  Gastrointestinal: Negative for nausea, vomiting, abdominal pain, diarrhea and constipation.  Genitourinary: Negative  for dysuria, difficulty urinating and vaginal pain.  Musculoskeletal: Positive for myalgias. Negative for arthralgias.  Skin: Negative for rash.  Neurological: Negative for syncope and headaches.  Psychiatric/Behavioral: Negative for behavioral problems.  All other systems reviewed and are negative.     Allergies  Review of patient's allergies indicates no known allergies.  Home Medications   Prior to Admission medications   Medication Sig Start Date End Date Taking? Authorizing Provider  aspirin EC 81 MG tablet Take 81 mg by mouth See admin instructions. Take 1 tablet (81 mg) daily, take an additional 1 tablet daily if needed for pain 10/24/11  Yes Brent BullaErik D Ritch, MD  HYDROcodone-acetaminophen (NORCO/VICODIN) 5-325 MG per tablet Take 1 tablet by mouth every 4 (four) hours as needed for moderate pain. 11/26/12  Yes Lyanne CoKevin M Campos, MD  lisinopril-hydrochlorothiazide (PRINZIDE,ZESTORETIC) 20-12.5 MG per tablet Take 1 tablet by mouth 2 (two) times daily. 11/26/12  Yes Lyanne CoKevin M Campos, MD  metFORMIN (GLUCOPHAGE) 1000 MG tablet Take 1,000 mg by mouth daily.    Yes Historical Provider, MD   BP 137/53  Pulse 72  Temp(Src) 98.9 F (37.2 C) (Oral)  Resp 20  Ht 5' (1.524 m)  Wt 198 lb (89.812 kg)  BMI 38.67 kg/m2  SpO2 98%  LMP 10/26/2013 Physical Exam  Vitals reviewed. Constitutional: She is oriented to person, place, and time. She appears well-developed and well-nourished. No distress.  HENT:  Head: Normocephalic and atraumatic.  Mouth/Throat: No oropharyngeal exudate.  Eyes: EOM are normal.  Neck: Normal range of motion.  Cardiovascular: Normal rate, regular rhythm and normal heart sounds.   No murmur heard. Pulmonary/Chest: Effort normal and breath sounds normal. No respiratory distress. She has no wheezes.  Abdominal: Soft. Bowel sounds are normal. She exhibits no distension. There is no tenderness. There is no rebound and no guarding.  Genitourinary: Rectal exam shows external  hemorrhoid.  Musculoskeletal: She exhibits no edema.  Lymphadenopathy:    She has cervical adenopathy.  Neurological: She is alert and oriented to person, place, and time.  Skin: She is not diaphoretic.  Psychiatric: She has a normal mood and affect. Her behavior is normal.    ED Course  Procedures (including critical care time) Labs Review Labs Reviewed  I-STAT CHEM 8, ED - Abnormal; Notable for the following:    Potassium 3.5 (*)    Glucose, Bld 151 (*)    Calcium, Ion 1.11 (*)    All other components within normal limits    Imaging Review Dg Chest 2 View  10/26/2013   CLINICAL DATA:  53 year old female with body aches. Hypertension. Shortness of breath.  EXAM: CHEST  2 VIEW  COMPARISON:  Chest x-ray 02/25/2007.  FINDINGS: Lung volumes are normal. No consolidative airspace disease. No pleural effusions. No pneumothorax. No pulmonary nodule or mass noted. Pulmonary vasculature and the cardiomediastinal silhouette are within normal limits.  IMPRESSION: No radiographic evidence of acute cardiopulmonary disease.   Electronically Signed   By: Trudie Reedaniel  Entrikin M.D.   On: 10/26/2013 18:16     EKG Interpretation None      MDM   Final diagnoses:  URI (upper respiratory infection)  Systolic murmur    Patient is a 53 year old female that presents with 3 days of URI like symptoms , 2 days of abdominal cramping that is typical for her normal menstrual cramps and she is currently on her period, external hemorrhoid for one year. On exam the patient has a benign abdomen. Patient's rectal exam reveals an external hemorrhoid that is not thrombosed. Patient denies abdominal pain at this time.  Patient has clear lung sounds with obvious rhinorrhea however tonsils the well-appearing with positive cervical lymphadenopathy.  Patient has a 3/6 systolic murmur that does not appear to be present on patient's prior visits. We will have the patient followup with cardiology. Chest x-ray shows no focal  findings to suggest pneumonia. Given the patient's overall well appearance with negative chest x-ray is patient is safe for discharge at this time.    Beverely Risenennis Avid Guillette, MD 10/26/13 2256

## 2013-10-26 NOTE — ED Provider Notes (Signed)
I saw and evaluated the patient, reviewed the resident's note and I agree with the findings and plan.   EKG Interpretation None     Patient here with URI type symptoms x3 days. Has had a cough with yellow sputum. Chest x-ray is negative. Patient complain of having a sore throat but I see no signs of erythema or exudate. Heart murmur noted on exam and patient will be given cardiology referral  Toy BakerAnthony T Shenea Giacobbe, MD 10/26/13 2216

## 2013-10-26 NOTE — ED Notes (Signed)
Pt presents with generalized body aches, cough, and not feeling well for 3 days- admits to coughing up yellow sputum.  Pt also c/o of a "bump in rectum" for the past year- pt reports it is painful to have a bowel movement.

## 2013-10-26 NOTE — ED Notes (Signed)
Pt remains monitored by blood pressure and pulse ox.  Pt family remains at bedside.

## 2013-10-26 NOTE — Discharge Instructions (Signed)
Upper Respiratory Infection, Adult An upper respiratory infection (URI) is also known as the common cold. It is often caused by a type of germ (virus). Colds are easily spread (contagious). You can pass it to others by kissing, coughing, sneezing, or drinking out of the same glass. Usually, you get better in 1 or 2 weeks.  HOME CARE   Only take medicine as told by your doctor.  Use a warm mist humidifier or breathe in steam from a hot shower.  Drink enough water and fluids to keep your pee (urine) clear or pale yellow.  Get plenty of rest.  Return to work when your temperature is back to normal or as told by your doctor. You may use a face mask and wash your hands to stop your cold from spreading. GET HELP RIGHT AWAY IF:   After the first few days, you feel you are getting worse.  You have questions about your medicine.  You have chills, shortness of breath, or brown or red spit (mucus).  You have yellow or brown snot (nasal discharge) or pain in the face, especially when you bend forward.  You have a fever, puffy (swollen) neck, pain when you swallow, or white spots in the back of your throat.  You have a bad headache, ear pain, sinus pain, or chest pain.  You have a high-pitched whistling sound when you breathe in and out (wheezing).  You have a lasting cough or cough up blood.  You have sore muscles or a stiff neck. MAKE SURE YOU:   Understand these instructions.  Will watch your condition.  Will get help right away if you are not doing well or get worse. Document Released: 06/12/2007 Document Revised: 03/18/2011 Document Reviewed: 03/31/2013 [employer] Patient Information 2015 [employer]. This information is not intended to replace advice given to you by your health care provider. Make sure you discuss any questions you have with your health care provider.  

## 2013-10-29 NOTE — ED Provider Notes (Signed)
I saw and evaluated the patient, reviewed the resident's note and I agree with the findings and plan.  Toy BakerAnthony T Kahliya Fraleigh, MD 10/29/13 [phone]

## 2013-11-26 ENCOUNTER — Ambulatory Visit (INDEPENDENT_AMBULATORY_CARE_PROVIDER_SITE_OTHER): Payer: BC Managed Care – PPO | Admitting: Cardiology

## 2013-11-26 ENCOUNTER — Encounter: Payer: Self-pay | Admitting: Cardiology

## 2013-11-26 VITALS — BP 160/82 | HR 66 | Ht 60.0 in | Wt 176.0 lb

## 2013-11-26 DIAGNOSIS — E119 Type 2 diabetes mellitus without complications: Secondary | ICD-10-CM | POA: Insufficient documentation

## 2013-11-26 DIAGNOSIS — E7800 Pure hypercholesterolemia, unspecified: Secondary | ICD-10-CM

## 2013-11-26 DIAGNOSIS — I10 Essential (primary) hypertension: Secondary | ICD-10-CM

## 2013-11-26 DIAGNOSIS — R011 Cardiac murmur, unspecified: Secondary | ICD-10-CM

## 2013-11-26 NOTE — Progress Notes (Signed)
  [address] [place], [place]  [address] Phone: [phone] Fax:  [phone]  Date:  11/26/2013   ID:  Samantha GarterWillie Ruth [name], DOB [birth year], MRN [id number]  PCP:  Pcp Not In System  Cardiologist:  Armanda Magicraci Turner, MD    History of Present Illness: Samantha [name] is a 53 y.o. female with a history of HTN, DM and dyslipidemia who presents today for evaluation of heart murmur.  She was recently seen in the ER in October for evaluation of URI and was noted to have a new heart murmur and is now referred here by Dr. Freida BusmanAllen for further evaluation.  She denies any chest pain, SOB, DOE, dizziness, palpitations or syncope.   She had some LE edema that resolved after stopping crestor.  Wt Readings from Last 3 Encounters:  11/26/13 176 lb (79.833 kg)  10/26/13 198 lb (89.812 kg)  11/26/12 198 lb (89.812 kg)     Past Medical History  Diagnosis Date  . Brain aneurysm   . Hypercholesteremia   . Hypertension   . DM w/o complication type II     Current Outpatient Prescriptions  Medication Sig Dispense Refill  . aspirin EC 81 MG tablet Take 81 mg by mouth See admin instructions. Take 1 tablet (81 mg) daily, take an additional 1 tablet daily if needed for pain    . lisinopril-hydrochlorothiazide (PRINZIDE,ZESTORETIC) 20-12.5 MG per tablet Take 1 tablet by mouth 2 (two) times daily. 60 tablet 3  . metFORMIN (GLUCOPHAGE) 1000 MG tablet Take 1,000 mg by mouth daily.     . simvastatin (ZOCOR) 20 MG tablet Take 20 mg by mouth.   0   No current facility-administered medications for this visit.    Allergies:   No Known Allergies  Social History:  The patient  reports that she has never smoked. She has never used smokeless tobacco. She reports that she does not drink alcohol or use illicit drugs.   Family History:  The patient's family history includes Aneurysm in her sister; CVA in her mother; Cancer in her brother and brother; Diabetes in her father; Heart attack in her father; Heart  disease in her father; Heart failure in her mother; Hypertension in her mother. There is no history of Other.   ROS:  Please see the history of present illness.      All other systems reviewed and negative.   PHYSICAL EXAM: VS:  BP 160/82 mmHg  Pulse 66  Ht 5' (1.524 m)  Wt 176 lb (79.833 kg)  BMI 34.37 kg/m2 Well nourished, well developed, in no acute distress HEENT: normal Neck: no JVD Cardiac:  normal S1, S2; RRR; no murmur Lungs:  clear to auscultation bilaterally, no wheezing, rhonchi or rales Abd: soft, nontender, no hepatomegaly Ext: no edema Skin: warm and dry Neuro:  CNs 2-12 intact, no focal abnormalities noted  EKG:    NSR with no ST changes   ASSESSMENT AND PLAN:  1. Heart murmur - will get a 2D echo to assess 2. HTN - controlled on Prinizide 3. Type II DM - followup with PCP 4. Dyslipidemia - per PCP  Followup with me PRN pending results of echo  Signed, Armanda Magicraci Turner, MD [facility] 11/26/2013 11:54 AM

## 2013-11-26 NOTE — Patient Instructions (Signed)
Your physician has requested that you have an echocardiogram. Echocardiography is a painless test that uses sound waves to create images of your heart. It provides your doctor with information about the size and shape of your heart and how well your heart's chambers and valves are working. This procedure takes approximately one hour. There are no restrictions for this procedure.  Your physician recommends that you schedule a follow-up appointment AS NEEDED with Dr. Mayford Knifeurner.

## 2013-12-01 ENCOUNTER — Ambulatory Visit (HOSPITAL_COMMUNITY): Payer: BC Managed Care – PPO | Attending: Cardiology | Admitting: Cardiology

## 2013-12-01 DIAGNOSIS — E119 Type 2 diabetes mellitus without complications: Secondary | ICD-10-CM | POA: Diagnosis not present

## 2013-12-01 DIAGNOSIS — I10 Essential (primary) hypertension: Secondary | ICD-10-CM | POA: Diagnosis not present

## 2013-12-01 DIAGNOSIS — R011 Cardiac murmur, unspecified: Secondary | ICD-10-CM | POA: Diagnosis present

## 2013-12-01 DIAGNOSIS — E785 Hyperlipidemia, unspecified: Secondary | ICD-10-CM | POA: Diagnosis not present

## 2013-12-01 NOTE — Progress Notes (Signed)
Echo performed. 

## 2013-12-08 ENCOUNTER — Telehealth: Payer: Self-pay | Admitting: Cardiology

## 2013-12-08 NOTE — Telephone Encounter (Signed)
Patient informed of ECHO results and verbal understanding expressed.   

## 2013-12-08 NOTE — Telephone Encounter (Signed)
New Message       Pt stating she is calling to get test results. Please call and advise.

## 2013-12-08 NOTE — Telephone Encounter (Signed)
Follow up ° ° ° ° °Want echo results °

## 2014-07-14 ENCOUNTER — Encounter (HOSPITAL_COMMUNITY): Payer: Self-pay | Admitting: Emergency Medicine

## 2014-07-14 ENCOUNTER — Emergency Department (HOSPITAL_COMMUNITY)
Admission: EM | Admit: 2014-07-14 | Discharge: 2014-07-15 | Disposition: A | Discharge status: Home / Self Care | Payer: 59 | Attending: Emergency Medicine | Admitting: Emergency Medicine

## 2014-07-14 DIAGNOSIS — Z9119 Patient's noncompliance with other medical treatment and regimen: Secondary | ICD-10-CM | POA: Diagnosis not present

## 2014-07-14 DIAGNOSIS — R5383 Other fatigue: Secondary | ICD-10-CM | POA: Insufficient documentation

## 2014-07-14 DIAGNOSIS — E780 Pure hypercholesterolemia: Secondary | ICD-10-CM | POA: Insufficient documentation

## 2014-07-14 DIAGNOSIS — I160 Hypertensive urgency: Secondary | ICD-10-CM

## 2014-07-14 DIAGNOSIS — Z7982 Long term (current) use of aspirin: Secondary | ICD-10-CM | POA: Insufficient documentation

## 2014-07-14 DIAGNOSIS — I10 Essential (primary) hypertension: Secondary | ICD-10-CM | POA: Diagnosis present

## 2014-07-14 DIAGNOSIS — R079 Chest pain, unspecified: Secondary | ICD-10-CM | POA: Insufficient documentation

## 2014-07-14 DIAGNOSIS — E119 Type 2 diabetes mellitus without complications: Secondary | ICD-10-CM | POA: Diagnosis not present

## 2014-07-14 DIAGNOSIS — Z9114 Patient's other noncompliance with medication regimen: Secondary | ICD-10-CM

## 2014-07-14 LAB — I-STAT CHEM 8, ED
BUN: 15 mg/dL (ref 6–20)
CHLORIDE: 102 mmol/L (ref 101–111)
Calcium, Ion: 1.23 mmol/L (ref 1.12–1.23)
Creatinine, Ser: 0.9 mg/dL (ref 0.44–1.00)
GLUCOSE: 122 mg/dL — AB (ref 65–99)
HEMATOCRIT: 35 % — AB (ref 36.0–46.0)
HEMOGLOBIN: 11.9 g/dL — AB (ref 12.0–15.0)
POTASSIUM: 3.4 mmol/L — AB (ref 3.5–5.1)
SODIUM: 140 mmol/L (ref 135–145)
TCO2: 25 mmol/L (ref 0–100)

## 2014-07-14 LAB — CBC WITH DIFFERENTIAL/PLATELET
BASOS ABS: 0 10*3/uL (ref 0.0–0.1)
Basophils Relative: 1 % (ref 0–1)
EOS PCT: 3 % (ref 0–5)
Eosinophils Absolute: 0.2 10*3/uL (ref 0.0–0.7)
HCT: 34.3 % — ABNORMAL LOW (ref 36.0–46.0)
Hemoglobin: 11 g/dL — ABNORMAL LOW (ref 12.0–15.0)
Lymphocytes Relative: 25 % (ref 12–46)
Lymphs Abs: 1.6 10*3/uL (ref 0.7–4.0)
MCH: 26.2 pg (ref 26.0–34.0)
MCHC: 32.1 g/dL (ref 30.0–36.0)
MCV: 81.7 fL (ref 78.0–100.0)
MONO ABS: 0.6 10*3/uL (ref 0.1–1.0)
Monocytes Relative: 9 % (ref 3–12)
Neutro Abs: 4 10*3/uL (ref 1.7–7.7)
Neutrophils Relative %: 62 % (ref 43–77)
PLATELETS: 265 10*3/uL (ref 150–400)
RBC: 4.2 MIL/uL (ref 3.87–5.11)
RDW: 16.1 % — AB (ref 11.5–15.5)
WBC: 6.5 10*3/uL (ref 4.0–10.5)

## 2014-07-14 LAB — CBG MONITORING, ED: Glucose-Capillary: 105 mg/dL — ABNORMAL HIGH (ref 65–99)

## 2014-07-14 NOTE — ED Notes (Signed)
Pt c/o being hypertensive and blood sugar being high.  St's she has been out of her Metformin and B/P meds for several months
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
## 2014-07-14 NOTE — ED Notes (Signed)
Dr Rhunette CroftNanavati aware of CBC clotting.  Stated we did not need to draw another one

## 2014-07-15 MED ORDER — METFORMIN HCL 1000 MG PO TABS: 1000.0000 mg | ORAL_TABLET | Freq: Every day | ORAL | Status: DC

## 2014-07-15 MED ORDER — HYDROCHLOROTHIAZIDE 25 MG PO TABS
12.5000 mg | ORAL_TABLET | Freq: Once | ORAL | Status: AC
  Administered 2014-07-15: 12.5 mg via ORAL
  Filled 2014-07-15: qty 1

## 2014-07-15 MED ORDER — LISINOPRIL-HYDROCHLOROTHIAZIDE 20-12.5 MG PO TABS: 1.0000 | ORAL_TABLET | Freq: Two times a day (BID) | ORAL | Status: DC

## 2014-07-15 MED ORDER — LISINOPRIL 20 MG PO TABS
20.0000 mg | ORAL_TABLET | Freq: Once | ORAL | Status: AC
  Administered 2014-07-15: 20 mg via ORAL
  Filled 2014-07-15: qty 1

## 2014-07-15 NOTE — Discharge Instructions (Signed)
Please restart the medicines. Remember, diabetes and hypertension are silent killers - if you don't manage them well, you will end up with brain strokes or heart attack.   Hypertension Hypertension, commonly called high blood pressure, is when the force of blood pumping through your arteries is too strong. Your arteries are the blood vessels that carry blood from your heart throughout your body. A blood pressure reading consists of a higher number over a lower number, such as 110/72. The higher number (systolic) is the pressure inside your arteries when your heart pumps. The lower number (diastolic) is the pressure inside your arteries when your heart relaxes. Ideally you want your blood pressure below 120/80. Hypertension forces your heart to work harder to pump blood. Your arteries may become narrow or stiff. Having hypertension puts you at risk for heart disease, stroke, and other problems.  RISK FACTORS Some risk factors for high blood pressure are controllable. Others are not.  Risk factors you cannot control include:   Race. You may be at higher risk if you are African American.  Age. Risk increases with age.  Gender. Men are at higher risk than women before age 40 years. After age 87, women are at higher risk than men. Risk factors you can control include:  Not getting enough exercise or physical activity.  Being overweight.  Getting too much fat, sugar, calories, or salt in your diet.  Drinking too much alcohol. SIGNS AND SYMPTOMS Hypertension does not usually cause signs or symptoms. Extremely high blood pressure (hypertensive crisis) may cause headache, anxiety, shortness of breath, and nosebleed. DIAGNOSIS  To check if you have hypertension, your health care provider will measure your blood pressure while you are seated, with your arm held at the level of your heart. It should be measured at least twice using the same arm. Certain conditions can cause a difference in blood  pressure between your right and left arms. A blood pressure reading that is higher than normal on one occasion does not mean that you need treatment. If one blood pressure reading is high, ask your health care provider about having it checked again. TREATMENT  Treating high blood pressure includes making lifestyle changes and possibly taking medicine. Living a healthy lifestyle can help lower high blood pressure. You may need to change some of your habits. Lifestyle changes may include:  Following the DASH diet. This diet is high in fruits, vegetables, and whole grains. It is low in salt, red meat, and added sugars.  Getting at least 2 hours of brisk physical activity every week.  Losing weight if necessary.  Not smoking.  Limiting alcoholic beverages.  Learning ways to reduce stress. If lifestyle changes are not enough to get your blood pressure under control, your health care provider may prescribe medicine. You may need to take more than one. Work closely with your health care provider to understand the risks and benefits. HOME CARE INSTRUCTIONS  Have your blood pressure rechecked as directed by your health care provider.   Take medicines only as directed by your health care provider. Follow the directions carefully. Blood pressure medicines must be taken as prescribed. The medicine does not work as well when you skip doses. Skipping doses also puts you at risk for problems.   Do not smoke.   Monitor your blood pressure at home as directed by your health care provider. SEEK MEDICAL CARE IF:   You think you are having a reaction to medicines taken.  You have recurrent headaches  or feel dizzy.  You have swelling in your ankles.  You have trouble with your vision. SEEK IMMEDIATE MEDICAL CARE IF:  You develop a severe headache or confusion.  You have unusual weakness, numbness, or feel faint.  You have severe chest or abdominal pain.  You vomit repeatedly.  You have  trouble breathing. MAKE SURE YOU:   Understand these instructions.  Will watch your condition.  Will get help right away if you are not doing well or get worse. Document Released: 12/24/2004 Document Revised: 05/10/2013 Document Reviewed: 10/16/2012 [facility] Patient Information 2015 [employer], [place]. This information is not intended to replace advice given to you by your health care provider. Make sure you discuss any questions you have with your health care provider.

## 2014-07-29 ENCOUNTER — Emergency Department (HOSPITAL_COMMUNITY)
Admission: EM | Admit: 2014-07-29 | Discharge: 2014-07-29 | Disposition: A | Discharge status: Home / Self Care | Payer: 59 | Attending: Emergency Medicine | Admitting: Emergency Medicine

## 2014-07-29 ENCOUNTER — Encounter (HOSPITAL_COMMUNITY): Payer: Self-pay | Admitting: *Deleted

## 2014-07-29 ENCOUNTER — Emergency Department (HOSPITAL_COMMUNITY): Payer: 59

## 2014-07-29 DIAGNOSIS — Y9289 Other specified places as the place of occurrence of the external cause: Secondary | ICD-10-CM | POA: Insufficient documentation

## 2014-07-29 DIAGNOSIS — Y9389 Activity, other specified: Secondary | ICD-10-CM | POA: Insufficient documentation

## 2014-07-29 DIAGNOSIS — X58XXXA Exposure to other specified factors, initial encounter: Secondary | ICD-10-CM | POA: Diagnosis not present

## 2014-07-29 DIAGNOSIS — Y998 Other external cause status: Secondary | ICD-10-CM | POA: Diagnosis not present

## 2014-07-29 DIAGNOSIS — S8991XA Unspecified injury of right lower leg, initial encounter: Secondary | ICD-10-CM | POA: Diagnosis present

## 2014-07-29 DIAGNOSIS — E782 Mixed hyperlipidemia: Secondary | ICD-10-CM | POA: Diagnosis not present

## 2014-07-29 DIAGNOSIS — Z79899 Other long term (current) drug therapy: Secondary | ICD-10-CM | POA: Insufficient documentation

## 2014-07-29 DIAGNOSIS — S8010XA Contusion of unspecified lower leg, initial encounter: Secondary | ICD-10-CM

## 2014-07-29 DIAGNOSIS — S8011XA Contusion of right lower leg, initial encounter: Secondary | ICD-10-CM | POA: Insufficient documentation

## 2014-07-29 DIAGNOSIS — I10 Essential (primary) hypertension: Secondary | ICD-10-CM | POA: Insufficient documentation

## 2014-07-29 DIAGNOSIS — Z7982 Long term (current) use of aspirin: Secondary | ICD-10-CM | POA: Diagnosis not present

## 2014-07-29 DIAGNOSIS — E119 Type 2 diabetes mellitus without complications: Secondary | ICD-10-CM | POA: Diagnosis not present

## 2014-07-29 MED ORDER — IBUPROFEN 400 MG PO TABS: 400.0000 mg | ORAL_TABLET | Freq: Four times a day (QID) | ORAL | Status: AC | PRN

## 2014-07-29 NOTE — ED Notes (Signed)
Pt reports hx of high BP. States that she took her meds today.
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
## 2014-07-29 NOTE — Discharge Instructions (Signed)
Contusion °A contusion is a deep bruise. Contusions happen when an injury causes bleeding under the skin. Signs of bruising include pain, puffiness (swelling), and discolored skin. The contusion may turn blue, purple, or yellow. °HOME CARE  °· Put ice on the injured area. °¨ Put ice in a plastic bag. °¨ Place a towel between your skin and the bag. °¨ Leave the ice on for 15-20 minutes, 03-04 times a day. °· Only take medicine as told by your doctor. °· Rest the injured area. °· If possible, raise (elevate) the injured area to lessen puffiness. °GET HELP RIGHT AWAY IF:  °· You have more bruising or puffiness. °· You have pain that is getting worse. °· Your puffiness or pain is not helped by medicine. °MAKE SURE YOU:  °· Understand these instructions. °· Will watch your condition. °· Will get help right away if you are not doing well or get worse. °Document Released: 06/12/2007 Document Revised: 03/18/2011 Document Reviewed: 10/29/2010 °Exit[employer]® Patient Information ©2015 [employer]. This information is not intended to replace advice given to you by your health care provider. Make sure you discuss any questions you have with your health care provider. ° °

## 2015-10-23 ENCOUNTER — Ambulatory Visit (HOSPITAL_COMMUNITY)
Admission: EM | Admit: 2015-10-23 | Discharge: 2015-10-23 | Disposition: A | Discharge status: Home / Self Care | Payer: BLUE CROSS/BLUE SHIELD | Attending: Emergency Medicine | Admitting: Emergency Medicine

## 2015-10-23 ENCOUNTER — Encounter (HOSPITAL_COMMUNITY): Payer: Self-pay | Admitting: Emergency Medicine

## 2015-10-23 DIAGNOSIS — E119 Type 2 diabetes mellitus without complications: Secondary | ICD-10-CM

## 2015-10-23 DIAGNOSIS — Z760 Encounter for issue of repeat prescription: Secondary | ICD-10-CM

## 2015-10-23 DIAGNOSIS — I10 Essential (primary) hypertension: Secondary | ICD-10-CM

## 2015-10-23 DIAGNOSIS — Z9119 Patient's noncompliance with other medical treatment and regimen: Secondary | ICD-10-CM | POA: Diagnosis not present

## 2015-10-23 DIAGNOSIS — Z91199 Patient's noncompliance with other medical treatment and regimen due to unspecified reason: Secondary | ICD-10-CM

## 2015-10-23 LAB — POCT I-STAT, CHEM 8
BUN: 13 mg/dL (ref 6–20)
Calcium, Ion: 1.19 mmol/L (ref 1.15–1.40)
Chloride: 101 mmol/L (ref 101–111)
Creatinine, Ser: 0.8 mg/dL (ref 0.44–1.00)
Glucose, Bld: 172 mg/dL — ABNORMAL HIGH (ref 65–99)
HEMATOCRIT: 42 % (ref 36.0–46.0)
Hemoglobin: 14.3 g/dL (ref 12.0–15.0)
Potassium: 3.6 mmol/L (ref 3.5–5.1)
SODIUM: 140 mmol/L (ref 135–145)
TCO2: 28 mmol/L (ref 0–100)

## 2015-10-23 MED ORDER — LISINOPRIL-HYDROCHLOROTHIAZIDE 20-12.5 MG PO TABS: 1.0000 | ORAL_TABLET | Freq: Every day | ORAL | 0 refills | Status: AC

## 2015-10-23 MED ORDER — METFORMIN HCL 1000 MG PO TABS: ORAL_TABLET | ORAL | 0 refills | Status: AC

## 2015-10-23 NOTE — ED Triage Notes (Addendum)
Patient has been without blood pressure medicine and diabetic medicine for 2 months.  Patient works 2 jobs and is tired all the time.  Denies pain
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
## 2015-10-23 NOTE — Discharge Instructions (Signed)
Must be checking her blood sugars on a daily basis. Follow-up your primary care doctor as soon as possible, call for an appointment. He need additional lab work that we do not have here. The urgent care does not have the resources to provide the special needs that you have with your conditions. Please follow-up with your doctor.

## 2015-10-23 NOTE — ED Notes (Signed)
This nurse not notified of this blood pressure reading

## 2016-07-10 IMAGING — CR DG CHEST 2V
2 series · 2 of 2 positions shown · non-contrast
Comparison: Chest x-ray 02/25/2007.

CLINICAL DATA: 52-year-old female with body aches. Hypertension.
Shortness of breath.

EXAM:
CHEST  2 VIEW

[w chest pa]
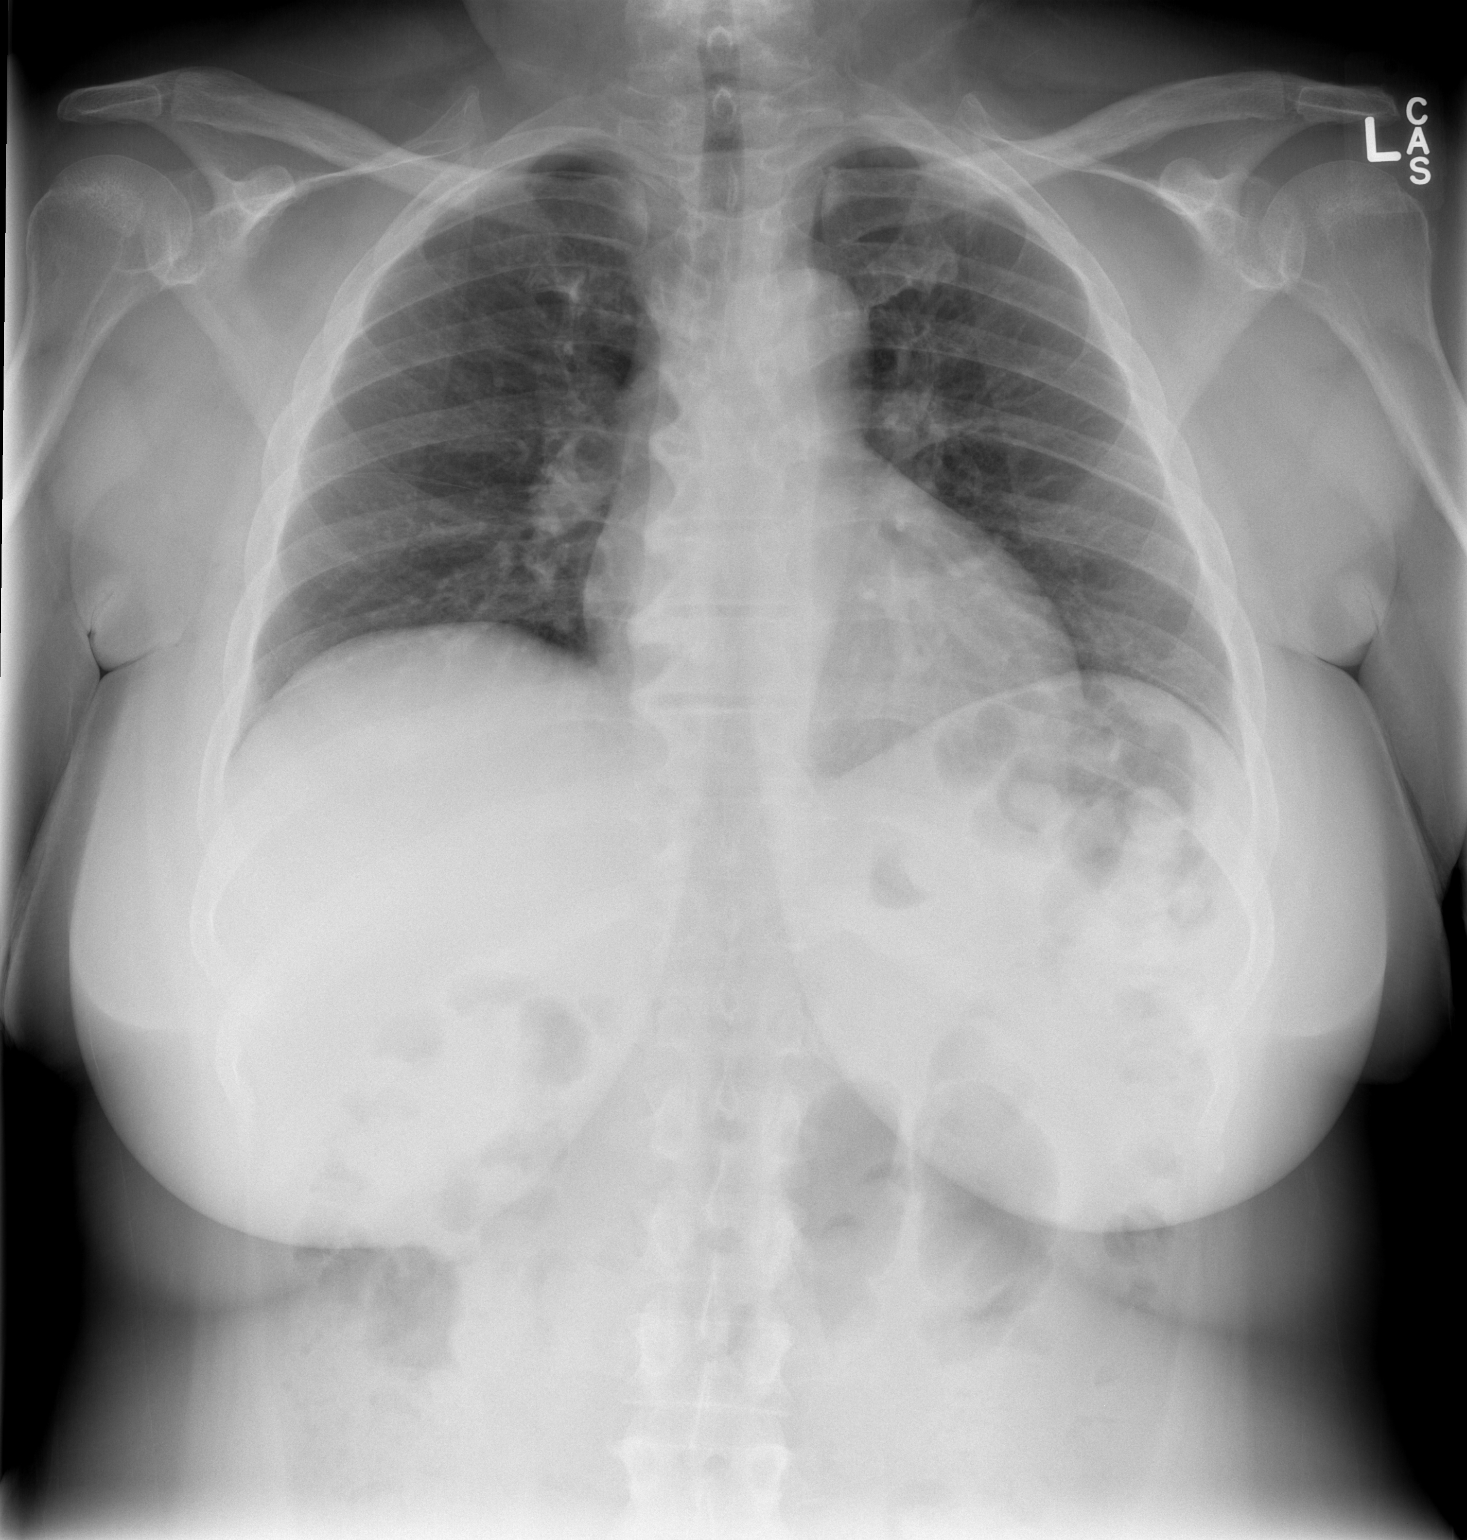

[w chest lat]
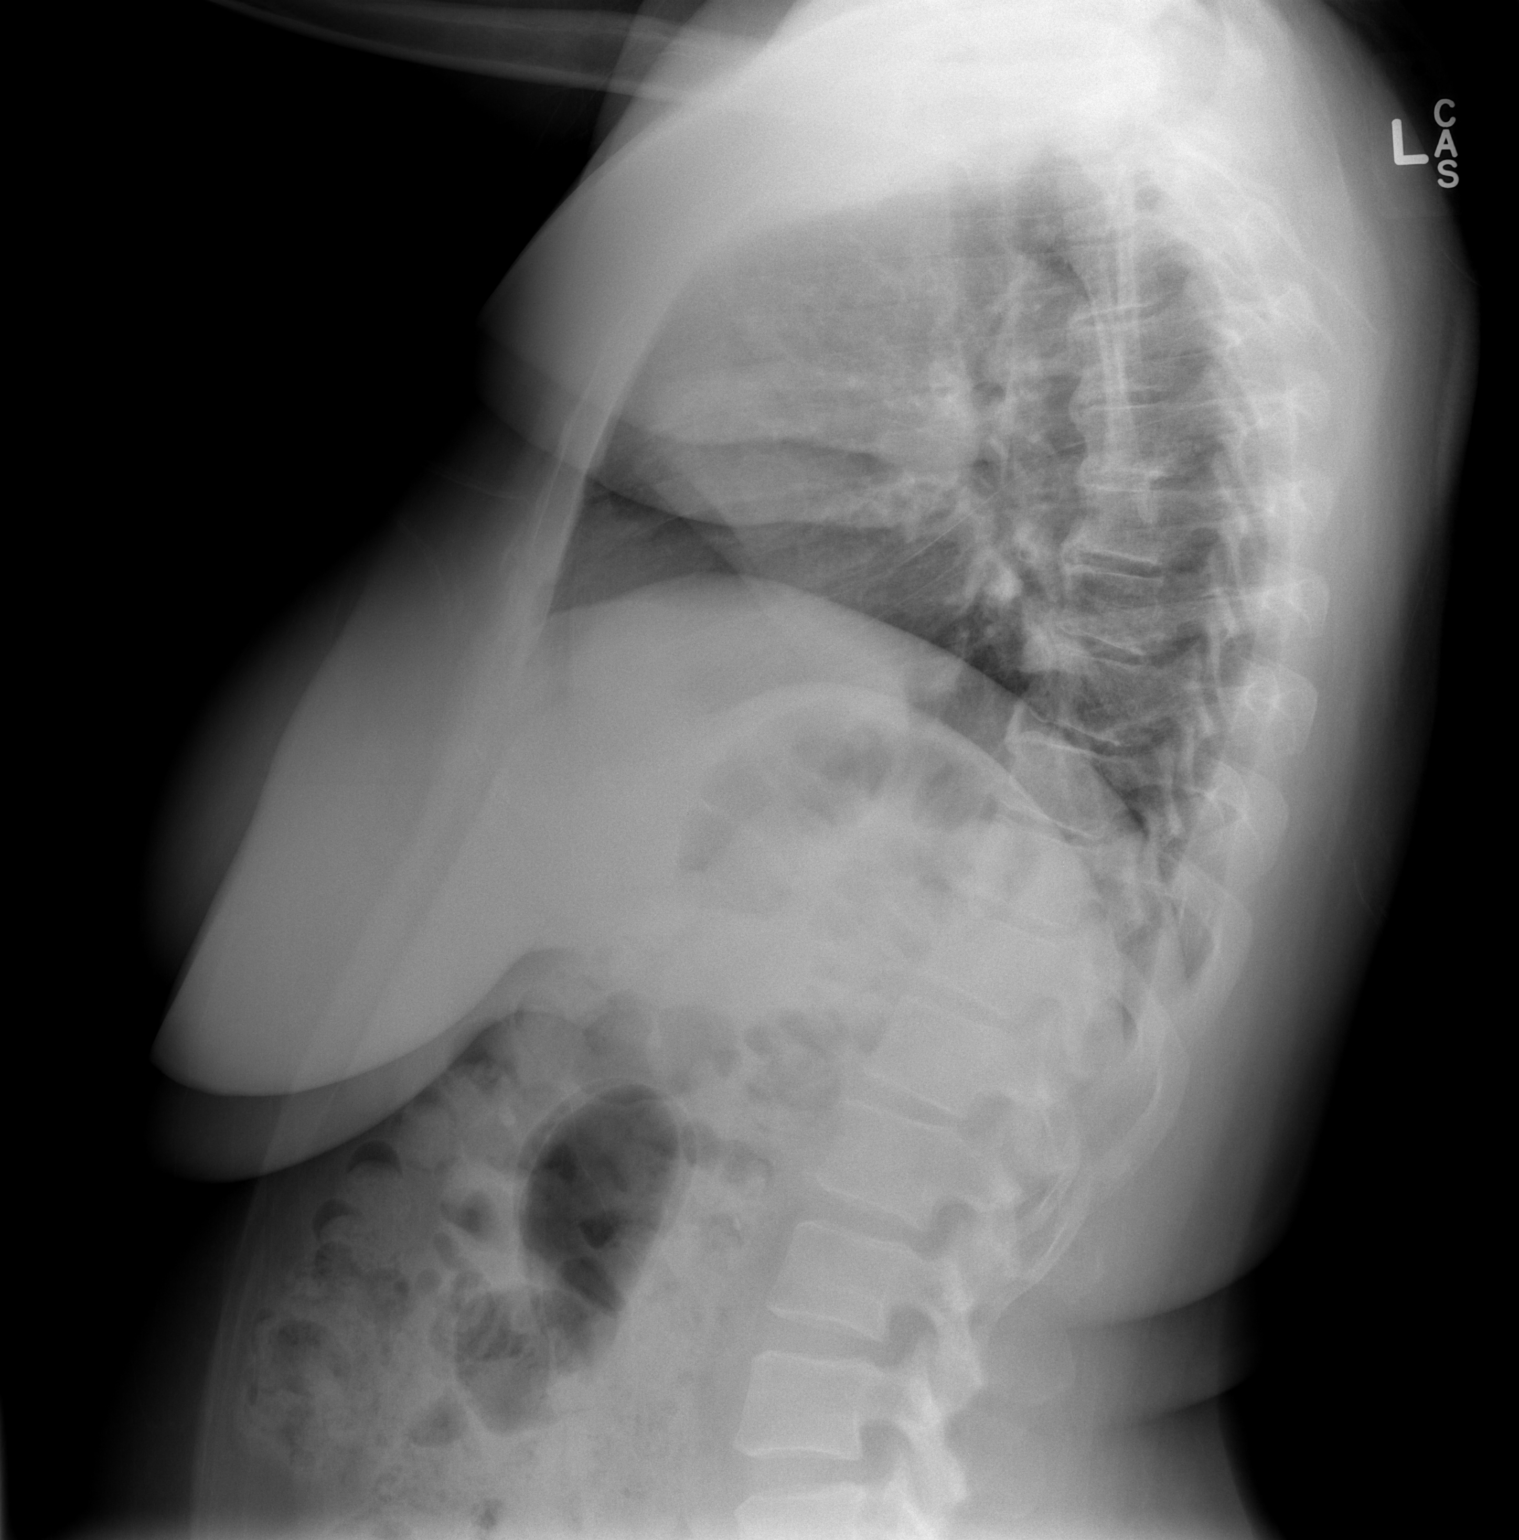

[2 of 2 positions shown; findings below may reference images not displayed]

FINDINGS: Lung volumes are normal. No consolidative airspace disease. No
pleural effusions. No pneumothorax. No pulmonary nodule or mass
noted. Pulmonary vasculature and the cardiomediastinal silhouette
are within normal limits.
IMPRESSION: No radiographic evidence of acute cardiopulmonary disease.

## 2017-11-24 ENCOUNTER — Ambulatory Visit (HOSPITAL_COMMUNITY)
Admission: EM | Admit: 2017-11-24 | Discharge: 2017-11-24 | Disposition: A | Discharge status: Home / Self Care | Payer: BLUE CROSS/BLUE SHIELD | Attending: Family Medicine | Admitting: Family Medicine

## 2017-11-24 ENCOUNTER — Other Ambulatory Visit: Payer: Self-pay

## 2017-11-24 ENCOUNTER — Encounter (HOSPITAL_COMMUNITY): Payer: Self-pay | Admitting: Emergency Medicine

## 2017-11-24 DIAGNOSIS — S90921A Unspecified superficial injury of right foot, initial encounter: Secondary | ICD-10-CM | POA: Diagnosis not present

## 2017-11-24 DIAGNOSIS — E119 Type 2 diabetes mellitus without complications: Secondary | ICD-10-CM

## 2017-11-24 DIAGNOSIS — S91301A Unspecified open wound, right foot, initial encounter: Secondary | ICD-10-CM

## 2017-11-24 DIAGNOSIS — I10 Essential (primary) hypertension: Secondary | ICD-10-CM

## 2017-11-24 NOTE — ED Provider Notes (Signed)
[facility]    CSN: [id number] Arrival date & time: 11/24/17  1120     History   Chief Complaint Chief Complaint  Patient presents with  . Foreign Body in Skin    right foot    HPI Samantha [name] is a 57 y.o. female.   HPI  She has a sensation of foreign body on the bottom of her foot.  It was bothering her today.  She went to her primary care doctor.  The moment worried about her blood pressure being elevated.  They looked at her foot and told her to come to urgent care center.  She does not remember breaking any glass.  She thinks that someone else in the house might have.  She is worried that there is glass in her foot.  She is a diabetic.  She was told by her doctor to get this checked here. She is compliant with her diabetic diet care.  No neuropathy that is known. She is compliant with her blood pressure medications.  They added a clonidine patch today.  She states that it is making her tired.  No headache or neurologic symptoms.  Blood pressure here is 166/77   Past Medical History:  Diagnosis Date  . Brain aneurysm   . 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 Hypercholesteremia   . Hypertension     Patient Active Problem List   Diagnosis Date Noted  . Heart murmur 11/26/2013  . Hypercholesteremia   . Hypertension   . DM w/o complication type II [facility])     Past Surgical History:  Procedure Laterality Date  . BRAIN SURGERY     States they went through her stomach to repair  bleed in her head in '05  . CEREBRAL ANEURYSM REPAIR     in 2005    OB History    Gravida  0   Para      Term      Preterm      AB      Living        SAB      TAB      Ectopic      Multiple      Live Births               Home Medications    Prior to Admission medications   Medication Sig Start Date End Date Taking? Authorizing Provider  aspirin EC 81 MG tablet Take 81 mg by mouth See admin instructions. Take 1 tablet (81 mg) daily, take an  additional 1 tablet daily if needed for pain 10/24/11  Yes Ritch, Gasper Lloyd, MD  cloNIDine (CATAPRES - DOSED IN MG/24 HR) 0.2 mg/24hr patch APPLY 1 PATCH TOPICALLY ONCE A WEEK 09/19/17  Yes [provider]  lisinopril-hydrochlorothiazide (ZESTORETIC) 20-12.5 MG tablet Take 1 tablet by mouth daily. 10/23/15  Yes Mabe, Onalee Hua, NP  metFORMIN (GLUCOPHAGE) 1000 MG tablet Take one tablet po with evening meal 10/23/15  Yes Mabe, David, NP  simvastatin (ZOCOR) 20 MG tablet Take 20 mg by mouth.  11/03/13  Yes [provider]  ibuprofen (ADVIL,MOTRIN) 400 MG tablet Take 1 tablet (400 mg total) by mouth every 6 (six) hours as needed. 07/29/14   Linwood Dibbles, MD    Family History Family History  Problem Relation Age of Onset  . Hypertension Mother   . Heart failure Mother   . CVA Mother   . Diabetes Father   . Heart disease  Father   . Heart attack Father   . Aneurysm Sister   . Cancer Brother   . Cancer Brother   . Other Neg Hx     Social History Social History   Tobacco Use  . Smoking status: Never Smoker  . Smokeless tobacco: Never Used  Substance Use Topics  . Alcohol use: No  . Drug use: No     Allergies   Patient has no known allergies.   Review of Systems Review of Systems  Constitutional: Negative for chills and fever.  HENT: Negative for ear pain and sore throat.   Eyes: Negative for pain and visual disturbance.  Respiratory: Negative for cough and shortness of breath.   Cardiovascular: Negative for chest pain and palpitations.  Gastrointestinal: Negative for abdominal pain and vomiting.  Genitourinary: Negative for dysuria and hematuria.  Musculoskeletal: Negative for arthralgias and back pain.  Skin: Positive for wound. Negative for color change and rash.  Neurological: Negative for seizures and syncope.  All other systems reviewed and are negative.    Physical Exam Triage Vital Signs ED Triage Vitals  Enc Vitals Group     BP 11/24/17 1222 (!) 175/75      Pulse Rate 11/24/17 1222 62     Resp --      Temp 11/24/17 1222 98 F (36.7 C)     Temp Source 11/24/17 1222 Oral     SpO2 11/24/17 1222 97 %     Weight --      Height --      Head Circumference --      Peak Flow --      Pain Score 11/24/17 1224 4     Pain Loc --      Pain Edu? --      Excl. in GC? --    No data found.  Updated Vital Signs BP (!) 166/77 (BP Location: Left Arm)   Pulse 62   Temp 98 F (36.7 C) (Oral)   LMP 10/24/2017 (Approximate)   SpO2 97%      Physical Exam  Constitutional: She appears well-developed and well-nourished. No distress.  HENT:  Head: Normocephalic and atraumatic.  Mouth/Throat: Oropharynx is clear and moist.  Eyes: Pupils are equal, round, and reactive to light. Conjunctivae are normal.  Neck: Normal range of motion.  Cardiovascular: Normal rate.  Pulmonary/Chest: Effort normal. No respiratory distress.  Abdominal: Soft. She exhibits no distension.  Musculoskeletal: Normal range of motion. She exhibits no edema.  Neurological: She is alert.  Skin: Skin is warm and dry.  On the bottom of the right foot there is a linear superficial abrasion.  No bleeding.  No foreign body is seen.  Foot was soaked for 20 minutes in warm soapy solution.  Reexamined.  Some of the dead skin was debrided away.  Still cannot see or feel a foreign body.  Psychiatric:  Poor fund of knowledge     UC Treatments / Results  Labs (all labs ordered are listed, but only abnormal results are displayed) Labs Reviewed - No data to display  EKG None  Radiology No results found.  Procedures Procedures (including critical care time)  Medications Ordered in UC Medications - No data to display  Initial Impression / Assessment and Plan / UC Course  I have reviewed the triage vital signs and the nursing notes.  Pertinent labs & imaging results that were available during my care of the patient were reviewed by me and considered in my medical decision  making  (see chart for details).     If there is a foreign body that is not detectable on examination, she may develop infection.  Signs of infection are reviewed, redness, pus, increased swelling, increased pain.  At that point she should come back for reevaluation.  I  I do not anticipate complication Final Clinical Impressions(s) / UC Diagnoses   Final diagnoses:  Open wound of right foot with complication, initial encounter     Discharge Instructions     I do not see any glass in the foot wound I think it is a small cut Look at it every day to check for healing and signs of infection Return as needed   ED Prescriptions    None     Controlled Substance Prescriptions [place] Controlled Substance Registry consulted? Not Applicable   Eustace MooreNelson, Kennadie Brenner Sue, MD 11/24/17 1351

## 2017-11-24 NOTE — ED Triage Notes (Signed)
Pt states she has a piece of glass in the bottom of her right foot from last night.  Pt had her blood work drawn today and they told her her BP was elevated so they put a BP patch on her left upper arm and gave her a Rx.    Pt is diabetic so they were concerned for proper care to the foot.

## 2017-11-24 NOTE — ED Notes (Signed)
Pt called to triage x1.  No answer. 

## 2017-11-24 NOTE — Discharge Instructions (Addendum)
I do not see any glass in the foot wound I think it is a small cut Look at it every day to check for healing and signs of infection Return as needed

## 2020-08-27 ENCOUNTER — Encounter (HOSPITAL_COMMUNITY): Payer: Self-pay | Admitting: Emergency Medicine

## 2020-08-27 ENCOUNTER — Inpatient Hospital Stay (HOSPITAL_COMMUNITY): Payer: Self-pay

## 2020-08-27 ENCOUNTER — Inpatient Hospital Stay (HOSPITAL_COMMUNITY): Payer: Medicaid Other

## 2020-08-27 ENCOUNTER — Emergency Department (HOSPITAL_COMMUNITY): Payer: Self-pay

## 2020-08-27 ENCOUNTER — Inpatient Hospital Stay (HOSPITAL_COMMUNITY)
Admission: EM | Admit: 2020-08-27 | Discharge: 2020-09-07 | DRG: 870 | Disposition: E | Payer: Self-pay | Attending: Critical Care Medicine | Admitting: Critical Care Medicine

## 2020-08-27 DIAGNOSIS — J9600 Acute respiratory failure, unspecified whether with hypoxia or hypercapnia: Secondary | ICD-10-CM | POA: Diagnosis present

## 2020-08-27 DIAGNOSIS — J150 Pneumonia due to Klebsiella pneumoniae: Secondary | ICD-10-CM | POA: Diagnosis present

## 2020-08-27 DIAGNOSIS — B961 Klebsiella pneumoniae [K. pneumoniae] as the cause of diseases classified elsewhere: Secondary | ICD-10-CM | POA: Diagnosis present

## 2020-08-27 DIAGNOSIS — J9690 Respiratory failure, unspecified, unspecified whether with hypoxia or hypercapnia: Secondary | ICD-10-CM

## 2020-08-27 DIAGNOSIS — I6389 Other cerebral infarction: Secondary | ICD-10-CM

## 2020-08-27 DIAGNOSIS — G8191 Hemiplegia, unspecified affecting right dominant side: Secondary | ICD-10-CM | POA: Diagnosis present

## 2020-08-27 DIAGNOSIS — E1101 Type 2 diabetes mellitus with hyperosmolarity with coma: Secondary | ICD-10-CM | POA: Diagnosis present

## 2020-08-27 DIAGNOSIS — N289 Disorder of kidney and ureter, unspecified: Secondary | ICD-10-CM

## 2020-08-27 DIAGNOSIS — G936 Cerebral edema: Secondary | ICD-10-CM | POA: Diagnosis present

## 2020-08-27 DIAGNOSIS — R092 Respiratory arrest: Secondary | ICD-10-CM

## 2020-08-27 DIAGNOSIS — R509 Fever, unspecified: Secondary | ICD-10-CM

## 2020-08-27 DIAGNOSIS — R569 Unspecified convulsions: Secondary | ICD-10-CM | POA: Diagnosis present

## 2020-08-27 DIAGNOSIS — A4159 Other Gram-negative sepsis: Principal | ICD-10-CM | POA: Diagnosis present

## 2020-08-27 DIAGNOSIS — E7800 Pure hypercholesterolemia, unspecified: Secondary | ICD-10-CM | POA: Diagnosis present

## 2020-08-27 DIAGNOSIS — Z452 Encounter for adjustment and management of vascular access device: Secondary | ICD-10-CM

## 2020-08-27 DIAGNOSIS — I471 Supraventricular tachycardia: Secondary | ICD-10-CM | POA: Diagnosis not present

## 2020-08-27 DIAGNOSIS — E1100 Type 2 diabetes mellitus with hyperosmolarity without nonketotic hyperglycemic-hyperosmolar coma (NKHHC): Secondary | ICD-10-CM

## 2020-08-27 DIAGNOSIS — E1165 Type 2 diabetes mellitus with hyperglycemia: Secondary | ICD-10-CM | POA: Diagnosis present

## 2020-08-27 DIAGNOSIS — Z8679 Personal history of other diseases of the circulatory system: Secondary | ICD-10-CM

## 2020-08-27 DIAGNOSIS — E11649 Type 2 diabetes mellitus with hypoglycemia without coma: Secondary | ICD-10-CM | POA: Diagnosis not present

## 2020-08-27 DIAGNOSIS — Z515 Encounter for palliative care: Secondary | ICD-10-CM

## 2020-08-27 DIAGNOSIS — J690 Pneumonitis due to inhalation of food and vomit: Secondary | ICD-10-CM | POA: Diagnosis present

## 2020-08-27 DIAGNOSIS — Z20822 Contact with and (suspected) exposure to covid-19: Secondary | ICD-10-CM | POA: Diagnosis present

## 2020-08-27 DIAGNOSIS — Z7982 Long term (current) use of aspirin: Secondary | ICD-10-CM

## 2020-08-27 DIAGNOSIS — I639 Cerebral infarction, unspecified: Secondary | ICD-10-CM

## 2020-08-27 DIAGNOSIS — Z8249 Family history of ischemic heart disease and other diseases of the circulatory system: Secondary | ICD-10-CM

## 2020-08-27 DIAGNOSIS — R29717 NIHSS score 17: Secondary | ICD-10-CM | POA: Diagnosis present

## 2020-08-27 DIAGNOSIS — E860 Dehydration: Secondary | ICD-10-CM | POA: Diagnosis present

## 2020-08-27 DIAGNOSIS — R739 Hyperglycemia, unspecified: Secondary | ICD-10-CM

## 2020-08-27 DIAGNOSIS — E669 Obesity, unspecified: Secondary | ICD-10-CM | POA: Diagnosis present

## 2020-08-27 DIAGNOSIS — I63312 Cerebral infarction due to thrombosis of left middle cerebral artery: Secondary | ICD-10-CM

## 2020-08-27 DIAGNOSIS — Z823 Family history of stroke: Secondary | ICD-10-CM

## 2020-08-27 DIAGNOSIS — E870 Hyperosmolality and hypernatremia: Secondary | ICD-10-CM | POA: Diagnosis not present

## 2020-08-27 DIAGNOSIS — Z66 Do not resuscitate: Secondary | ICD-10-CM | POA: Diagnosis not present

## 2020-08-27 DIAGNOSIS — Z6835 Body mass index (BMI) 35.0-35.9, adult: Secondary | ICD-10-CM

## 2020-08-27 DIAGNOSIS — Z833 Family history of diabetes mellitus: Secondary | ICD-10-CM

## 2020-08-27 DIAGNOSIS — I10 Essential (primary) hypertension: Secondary | ICD-10-CM | POA: Diagnosis present

## 2020-08-27 DIAGNOSIS — Z8673 Personal history of transient ischemic attack (TIA), and cerebral infarction without residual deficits: Secondary | ICD-10-CM

## 2020-08-27 DIAGNOSIS — I63412 Cerebral infarction due to embolism of left middle cerebral artery: Secondary | ICD-10-CM | POA: Diagnosis present

## 2020-08-27 DIAGNOSIS — J988 Other specified respiratory disorders: Secondary | ICD-10-CM

## 2020-08-27 DIAGNOSIS — R4182 Altered mental status, unspecified: Secondary | ICD-10-CM

## 2020-08-27 DIAGNOSIS — I169 Hypertensive crisis, unspecified: Secondary | ICD-10-CM

## 2020-08-27 DIAGNOSIS — Z79899 Other long term (current) drug therapy: Secondary | ICD-10-CM

## 2020-08-27 DIAGNOSIS — J9601 Acute respiratory failure with hypoxia: Secondary | ICD-10-CM | POA: Diagnosis present

## 2020-08-27 DIAGNOSIS — R339 Retention of urine, unspecified: Secondary | ICD-10-CM | POA: Diagnosis not present

## 2020-08-27 DIAGNOSIS — Z7984 Long term (current) use of oral hypoglycemic drugs: Secondary | ICD-10-CM

## 2020-08-27 DIAGNOSIS — I161 Hypertensive emergency: Secondary | ICD-10-CM | POA: Diagnosis present

## 2020-08-27 DIAGNOSIS — N179 Acute kidney failure, unspecified: Secondary | ICD-10-CM | POA: Diagnosis present

## 2020-08-27 DIAGNOSIS — T796XXA Traumatic ischemia of muscle, initial encounter: Secondary | ICD-10-CM

## 2020-08-27 DIAGNOSIS — G931 Anoxic brain damage, not elsewhere classified: Secondary | ICD-10-CM | POA: Diagnosis not present

## 2020-08-27 LAB — BASIC METABOLIC PANEL
Anion gap: 15 (ref 5–15)
Anion gap: 6 (ref 5–15)
Anion gap: 4 — ABNORMAL LOW (ref 5–15)
BUN: 58 mg/dL — ABNORMAL HIGH (ref 6–20)
BUN: 57 mg/dL — ABNORMAL HIGH (ref 6–20)
BUN: 49 mg/dL — ABNORMAL HIGH (ref 6–20)
CO2: 19 mmol/L — ABNORMAL LOW (ref 22–32)
CO2: 25 mmol/L (ref 22–32)
CO2: 27 mmol/L (ref 22–32)
Calcium: 9.3 mg/dL (ref 8.9–10.3)
Calcium: 9.2 mg/dL (ref 8.9–10.3)
Calcium: 9.3 mg/dL (ref 8.9–10.3)
Chloride: 117 mmol/L — ABNORMAL HIGH (ref 98–111)
Chloride: 126 mmol/L — ABNORMAL HIGH (ref 98–111)
Chloride: 129 mmol/L — ABNORMAL HIGH (ref 98–111)
Creatinine, Ser: 1.84 mg/dL — ABNORMAL HIGH (ref 0.44–1.00)
Creatinine, Ser: 1.82 mg/dL — ABNORMAL HIGH (ref 0.44–1.00)
Creatinine, Ser: 1.33 mg/dL — ABNORMAL HIGH (ref 0.44–1.00)
GFR, Estimated: 32 mL/min — ABNORMAL LOW (ref >60)
GFR, Estimated: 32 mL/min — ABNORMAL LOW (ref >60)
GFR, Estimated: 47 mL/min — ABNORMAL LOW (ref >60)
Glucose, Bld: 608 mg/dL (ref 70–99)
Glucose, Bld: 317 mg/dL — ABNORMAL HIGH (ref 70–99)
Glucose, Bld: 216 mg/dL — ABNORMAL HIGH (ref 70–99)
Potassium: 3.7 mmol/L (ref 3.5–5.1)
Potassium: 2.9 mmol/L — ABNORMAL LOW (ref 3.5–5.1)
Potassium: 3.2 mmol/L — ABNORMAL LOW (ref 3.5–5.1)
Sodium: 151 mmol/L — ABNORMAL HIGH (ref 135–145)
Sodium: 157 mmol/L — ABNORMAL HIGH (ref 135–145)
Sodium: 160 mmol/L — ABNORMAL HIGH (ref 135–145)

## 2020-08-27 LAB — ECHOCARDIOGRAM COMPLETE
AR max vel: 1.85 cm2
AV Area VTI: 2.16 cm2
AV Area mean vel: 1.87 cm2
AV Mean grad: 7 mmHg
AV Peak grad: 12.8 mmHg
Ao pk vel: 1.79 m/s
Area-P 1/2: 3.15 cm2
Height: 61 in
MV VTI: 2.24 cm2
S' Lateral: 2 cm
Weight: 3040 oz

## 2020-08-27 LAB — BLOOD CULTURE ID PANEL (REFLEXED) - BCID2

## 2020-08-27 LAB — RAPID URINE DRUG SCREEN, HOSP PERFORMED
Amphetamines: NOT DETECTED
Barbiturates: NOT DETECTED
Benzodiazepines: NOT DETECTED
Cocaine: NOT DETECTED
Opiates: NOT DETECTED
Tetrahydrocannabinol: NOT DETECTED

## 2020-08-27 LAB — LIPID PANEL
Cholesterol: 235 mg/dL — ABNORMAL HIGH (ref 0–200)
HDL: 32 mg/dL — ABNORMAL LOW (ref >40)
LDL Cholesterol: 158 mg/dL — ABNORMAL HIGH (ref 0–99)
Total CHOL/HDL Ratio: 7.3 RATIO
Triglycerides: 225 mg/dL — ABNORMAL HIGH (ref <150)
VLDL: 45 mg/dL — ABNORMAL HIGH (ref 0–40)

## 2020-08-27 LAB — I-STAT CHEM 8, ED
BUN: 68 mg/dL — ABNORMAL HIGH (ref 6–20)
Calcium, Ion: 1.07 mmol/L — ABNORMAL LOW (ref 1.15–1.40)
Chloride: 111 mmol/L (ref 98–111)
Creatinine, Ser: 2 mg/dL — ABNORMAL HIGH (ref 0.44–1.00)
Glucose, Bld: >700 mg/dL (ref 70–99)
HCT: 51 % — ABNORMAL HIGH (ref 36.0–46.0)
Hemoglobin: 17.3 g/dL — ABNORMAL HIGH (ref 12.0–15.0)
Potassium: 4.2 mmol/L (ref 3.5–5.1)
Sodium: 146 mmol/L — ABNORMAL HIGH (ref 135–145)
TCO2: 23 mmol/L (ref 22–32)

## 2020-08-27 LAB — HEMOGLOBIN A1C
Hgb A1c MFr Bld: 12.1 % — ABNORMAL HIGH (ref 4.8–5.6)
Mean Plasma Glucose: 300.57 mg/dL

## 2020-08-27 LAB — COMPREHENSIVE METABOLIC PANEL
ALT: 15 U/L (ref 0–44)
AST: 29 U/L (ref 15–41)
Albumin: 3.8 g/dL (ref 3.5–5.0)
Alkaline Phosphatase: 94 U/L (ref 38–126)
Anion gap: 21 — ABNORMAL HIGH (ref 5–15)
BUN: 59 mg/dL — ABNORMAL HIGH (ref 6–20)
CO2: 19 mmol/L — ABNORMAL LOW (ref 22–32)
Calcium: 9.6 mg/dL (ref 8.9–10.3)
Chloride: 101 mmol/L (ref 98–111)
Creatinine, Ser: 2.15 mg/dL — ABNORMAL HIGH (ref 0.44–1.00)
GFR, Estimated: 26 mL/min — ABNORMAL LOW (ref >60)
Glucose, Bld: 793 mg/dL (ref 70–99)
Potassium: 4.1 mmol/L (ref 3.5–5.1)
Sodium: 141 mmol/L (ref 135–145)
Total Bilirubin: 0.8 mg/dL (ref 0.3–1.2)
Total Protein: 8.5 g/dL — ABNORMAL HIGH (ref 6.5–8.1)

## 2020-08-27 LAB — GLUCOSE, CAPILLARY
Glucose-Capillary: 573 mg/dL (ref 70–99)
Glucose-Capillary: >600 mg/dL (ref 70–99)
Glucose-Capillary: 490 mg/dL — ABNORMAL HIGH (ref 70–99)
Glucose-Capillary: 568 mg/dL (ref 70–99)
Glucose-Capillary: 428 mg/dL — ABNORMAL HIGH (ref 70–99)
Glucose-Capillary: 396 mg/dL — ABNORMAL HIGH (ref 70–99)
Glucose-Capillary: 323 mg/dL — ABNORMAL HIGH (ref 70–99)
Glucose-Capillary: 217 mg/dL — ABNORMAL HIGH (ref 70–99)
Glucose-Capillary: 209 mg/dL — ABNORMAL HIGH (ref 70–99)
Glucose-Capillary: 219 mg/dL — ABNORMAL HIGH (ref 70–99)
Glucose-Capillary: 231 mg/dL — ABNORMAL HIGH (ref 70–99)
Glucose-Capillary: 178 mg/dL — ABNORMAL HIGH (ref 70–99)
Glucose-Capillary: 146 mg/dL — ABNORMAL HIGH (ref 70–99)
Glucose-Capillary: 165 mg/dL — ABNORMAL HIGH (ref 70–99)
Glucose-Capillary: 206 mg/dL — ABNORMAL HIGH (ref 70–99)

## 2020-08-27 LAB — CBC WITH DIFFERENTIAL/PLATELET
Abs Immature Granulocytes: 0.14 10*3/uL — ABNORMAL HIGH (ref 0.00–0.07)
Basophils Absolute: 0 10*3/uL (ref 0.0–0.1)
Basophils Relative: 0 %
Eosinophils Absolute: 0 10*3/uL (ref 0.0–0.5)
Eosinophils Relative: 0 %
HCT: 51.2 % — ABNORMAL HIGH (ref 36.0–46.0)
Hemoglobin: 15.7 g/dL — ABNORMAL HIGH (ref 12.0–15.0)
Immature Granulocytes: 1 %
Lymphocytes Relative: 3 %
Lymphs Abs: 0.6 10*3/uL — ABNORMAL LOW (ref 0.7–4.0)
MCH: 27.5 pg (ref 26.0–34.0)
MCHC: 30.7 g/dL (ref 30.0–36.0)
MCV: 89.7 fL (ref 80.0–100.0)
Monocytes Absolute: 1.1 10*3/uL — ABNORMAL HIGH (ref 0.1–1.0)
Monocytes Relative: 6 %
Neutro Abs: 17.4 10*3/uL — ABNORMAL HIGH (ref 1.7–7.7)
Neutrophils Relative %: 90 %
Platelets: 394 10*3/uL (ref 150–400)
RBC: 5.71 MIL/uL — ABNORMAL HIGH (ref 3.87–5.11)
RDW: 14.6 % (ref 11.5–15.5)
WBC: 19.3 10*3/uL — ABNORMAL HIGH (ref 4.0–10.5)
nRBC: 0 % (ref 0.0–0.2)

## 2020-08-27 LAB — RESP PANEL BY RT-PCR (FLU A&B, COVID) ARPGX2
Influenza A by PCR: NEGATIVE
Influenza B by PCR: NEGATIVE
SARS Coronavirus 2 by RT PCR: NEGATIVE

## 2020-08-27 LAB — I-STAT ARTERIAL BLOOD GAS, ED
Acid-base deficit: 3 mmol/L — ABNORMAL HIGH (ref 0.0–2.0)
Bicarbonate: 21.8 mmol/L (ref 20.0–28.0)
Calcium, Ion: 1.23 mmol/L (ref 1.15–1.40)
HCT: 46 % (ref 36.0–46.0)
Hemoglobin: 15.6 g/dL — ABNORMAL HIGH (ref 12.0–15.0)
O2 Saturation: 100 %
Patient temperature: 97.6
Potassium: 3.7 mmol/L (ref 3.5–5.1)
Sodium: 148 mmol/L — ABNORMAL HIGH (ref 135–145)
TCO2: 23 mmol/L (ref 22–32)
pCO2 arterial: 37.9 mmHg (ref 32.0–48.0)
pH, Arterial: 7.366 (ref 7.350–7.450)
pO2, Arterial: 273 mmHg — ABNORMAL HIGH (ref 83.0–108.0)

## 2020-08-27 LAB — PROTIME-INR
INR: 2.6 — ABNORMAL HIGH (ref 0.8–1.2)
Prothrombin Time: 28.2 seconds — ABNORMAL HIGH (ref 11.4–15.2)

## 2020-08-27 LAB — SODIUM: Sodium: 148 mmol/L — ABNORMAL HIGH (ref 135–145)

## 2020-08-27 LAB — LACTIC ACID, PLASMA: Lactic Acid, Venous: 3.5 mmol/L (ref 0.5–1.9)

## 2020-08-27 LAB — TROPONIN I (HIGH SENSITIVITY)
Troponin I (High Sensitivity): 50 ng/L — ABNORMAL HIGH (ref <18)
Troponin I (High Sensitivity): 69 ng/L — ABNORMAL HIGH (ref <18)

## 2020-08-27 LAB — URINALYSIS, ROUTINE W REFLEX MICROSCOPIC
Bilirubin Urine: NEGATIVE
Glucose, UA: >=500 mg/dL — AB
Ketones, ur: 20 mg/dL — AB
Leukocytes,Ua: NEGATIVE
Nitrite: NEGATIVE
Protein, ur: 100 mg/dL — AB
Specific Gravity, Urine: 1.027 (ref 1.005–1.030)
pH: 5 (ref 5.0–8.0)

## 2020-08-27 LAB — CBC
HCT: 43.2 % (ref 36.0–46.0)
Hemoglobin: 13.8 g/dL (ref 12.0–15.0)
MCH: 27.6 pg (ref 26.0–34.0)
MCHC: 31.9 g/dL (ref 30.0–36.0)
MCV: 86.4 fL (ref 80.0–100.0)
Platelets: 337 10*3/uL (ref 150–400)
RBC: 5 MIL/uL (ref 3.87–5.11)
RDW: 14.6 % (ref 11.5–15.5)
WBC: 20.9 10*3/uL — ABNORMAL HIGH (ref 4.0–10.5)
nRBC: 0 % (ref 0.0–0.2)

## 2020-08-27 LAB — I-STAT BETA HCG BLOOD, ED (MC, WL, AP ONLY): I-stat hCG, quantitative: <5 m[IU]/mL (ref <5)

## 2020-08-27 LAB — CBG MONITORING, ED
Glucose-Capillary: >600 mg/dL (ref 70–99)
Glucose-Capillary: >600 mg/dL (ref 70–99)

## 2020-08-27 LAB — PHOSPHORUS: Phosphorus: 3.2 mg/dL (ref 2.5–4.6)

## 2020-08-27 LAB — ACETAMINOPHEN LEVEL: Acetaminophen (Tylenol), Serum: <10 ug/mL — ABNORMAL LOW (ref 10–30)

## 2020-08-27 LAB — MRSA NEXT GEN BY PCR, NASAL: MRSA by PCR Next Gen: NOT DETECTED

## 2020-08-27 LAB — ETHANOL: Alcohol, Ethyl (B): <10 mg/dL (ref <10)

## 2020-08-27 LAB — MAGNESIUM: Magnesium: 3 mg/dL — ABNORMAL HIGH (ref 1.7–2.4)

## 2020-08-27 LAB — HIV ANTIBODY (ROUTINE TESTING W REFLEX): HIV Screen 4th Generation wRfx: NONREACTIVE

## 2020-08-27 LAB — CK
Total CK: 1130 U/L — ABNORMAL HIGH (ref 38–234)
Total CK: 1983 U/L — ABNORMAL HIGH (ref 38–234)

## 2020-08-27 LAB — AMMONIA: Ammonia: 28 umol/L (ref 9–35)

## 2020-08-27 LAB — SALICYLATE LEVEL: Salicylate Lvl: <7 mg/dL — ABNORMAL LOW (ref 7.0–30.0)

## 2020-08-27 LAB — OSMOLALITY: Osmolality: 307 mOsm/kg — ABNORMAL HIGH (ref 275–295)

## 2020-08-27 MED ORDER — SUCCINYLCHOLINE CHLORIDE 200 MG/10ML IV SOSY
PREFILLED_SYRINGE | INTRAVENOUS | Status: AC
  Filled 2020-08-27: qty 10

## 2020-08-27 MED ORDER — ETOMIDATE 2 MG/ML IV SOLN
INTRAVENOUS | Status: AC
  Filled 2020-08-27: qty 20

## 2020-08-27 MED ORDER — SIMVASTATIN 20 MG PO TABS
20.0000 mg | ORAL_TABLET | Freq: Every day | ORAL | Status: DC
  Filled 2020-08-27: qty 1

## 2020-08-27 MED ORDER — DEXTROSE 50 % IV SOLN: 0.0000 mL | INTRAVENOUS | Status: DC | PRN

## 2020-08-27 MED ORDER — FENTANYL CITRATE (PF) 100 MCG/2ML IJ SOLN: 50.0000 ug | INTRAMUSCULAR | Status: DC | PRN

## 2020-08-27 MED ORDER — ROCURONIUM BROMIDE 50 MG/5ML IV SOLN
INTRAVENOUS | Status: AC | PRN
Start: 2020-08-27 — End: 2020-08-27
  Administered 2020-08-27: 80 mg via INTRAVENOUS

## 2020-08-27 MED ORDER — DOCUSATE SODIUM 50 MG/5ML PO LIQD: 100.0000 mg | Freq: Two times a day (BID) | ORAL | Status: DC | PRN

## 2020-08-27 MED ORDER — PROPOFOL 1000 MG/100ML IV EMUL
INTRAVENOUS | Status: AC
  Administered 2020-08-27: 20 ug/kg/min
  Filled 2020-08-27: qty 100

## 2020-08-27 MED ORDER — SODIUM CHLORIDE 0.9 % IV SOLN: INTRAVENOUS | Status: DC

## 2020-08-27 MED ORDER — INSULIN ASPART 100 UNIT/ML IJ SOLN
2.0000 [IU] | INTRAMUSCULAR | Status: DC
  Administered 2020-08-27: 6 [IU] via SUBCUTANEOUS
  Administered 2020-08-28: 4 [IU] via SUBCUTANEOUS
  Administered 2020-08-28 (×2): 6 [IU] via SUBCUTANEOUS
  Administered 2020-08-28: 2 [IU] via SUBCUTANEOUS

## 2020-08-27 MED ORDER — INSULIN DETEMIR 100 UNIT/ML ~~LOC~~ SOLN
15.0000 [IU] | Freq: Two times a day (BID) | SUBCUTANEOUS | Status: DC
  Administered 2020-08-27 – 2020-08-28 (×3): 15 [IU] via SUBCUTANEOUS
  Filled 2020-08-27 (×4): qty 0.15

## 2020-08-27 MED ORDER — CHLORHEXIDINE GLUCONATE 0.12% ORAL RINSE (MEDLINE KIT)
15.0000 mL | Freq: Two times a day (BID) | OROMUCOSAL | Status: DC
  Administered 2020-08-27 – 2020-09-02 (×13): 15 mL via OROMUCOSAL

## 2020-08-27 MED ORDER — ATORVASTATIN CALCIUM 40 MG PO TABS
80.0000 mg | ORAL_TABLET | Freq: Every day | ORAL | Status: DC
  Administered 2020-08-27 – 2020-08-29 (×3): 80 mg
  Filled 2020-08-27 (×3): qty 2

## 2020-08-27 MED ORDER — PROPOFOL 1000 MG/100ML IV EMUL: 5.0000 ug/kg/min | INTRAVENOUS | Status: DC

## 2020-08-27 MED ORDER — ROCURONIUM BROMIDE 10 MG/ML (PF) SYRINGE
PREFILLED_SYRINGE | INTRAVENOUS | Status: AC
  Filled 2020-08-27: qty 10

## 2020-08-27 MED ORDER — SODIUM CHLORIDE 0.9 % IV SOLN
2.0000 g | INTRAVENOUS | Status: DC
  Administered 2020-08-28: 2 g via INTRAVENOUS
  Filled 2020-08-27: qty 2

## 2020-08-27 MED ORDER — NICARDIPINE HCL IN NACL 20-0.86 MG/200ML-% IV SOLN
INTRAVENOUS | Status: AC
  Filled 2020-08-27: qty 200

## 2020-08-27 MED ORDER — SODIUM CHLORIDE 0.9 % IV SOLN
2.0000 g | INTRAVENOUS | Status: DC
  Administered 2020-08-27: 2 g via INTRAVENOUS
  Filled 2020-08-27: qty 20

## 2020-08-27 MED ORDER — FENTANYL CITRATE (PF) 100 MCG/2ML IJ SOLN
INTRAMUSCULAR | Status: AC
  Filled 2020-08-27: qty 2

## 2020-08-27 MED ORDER — DEXTROSE 50 % IV SOLN
INTRAVENOUS | Status: AC
  Filled 2020-08-27: qty 50

## 2020-08-27 MED ORDER — ASPIRIN 325 MG PO TABS
325.0000 mg | ORAL_TABLET | Freq: Every day | ORAL | Status: DC
  Administered 2020-08-27 – 2020-08-29 (×3): 325 mg
  Filled 2020-08-27 (×3): qty 1

## 2020-08-27 MED ORDER — INSULIN REGULAR(HUMAN) IN NACL 100-0.9 UT/100ML-% IV SOLN: INTRAVENOUS | Status: DC

## 2020-08-27 MED ORDER — DEXTROSE IN LACTATED RINGERS 5 % IV SOLN: INTRAVENOUS | Status: DC

## 2020-08-27 MED ORDER — CHLORHEXIDINE GLUCONATE CLOTH 2 % EX PADS
6.0000 | MEDICATED_PAD | Freq: Every day | CUTANEOUS | Status: DC
  Administered 2020-08-27 – 2020-09-01 (×7): 6 via TOPICAL

## 2020-08-27 MED ORDER — SODIUM CHLORIDE 0.9% FLUSH: 10.0000 mL | INTRAVENOUS | Status: DC | PRN

## 2020-08-27 MED ORDER — LEVETIRACETAM IN NACL 500 MG/100ML IV SOLN
500.0000 mg | Freq: Two times a day (BID) | INTRAVENOUS | Status: DC
  Administered 2020-08-27 – 2020-08-30 (×7): 500 mg via INTRAVENOUS
  Filled 2020-08-27 (×7): qty 100

## 2020-08-27 MED ORDER — DOCUSATE SODIUM 100 MG PO CAPS: 100.0000 mg | ORAL_CAPSULE | Freq: Two times a day (BID) | ORAL | Status: DC | PRN

## 2020-08-27 MED ORDER — ETOMIDATE 2 MG/ML IV SOLN
INTRAVENOUS | Status: AC | PRN
  Administered 2020-08-27: 20 mg via INTRAVENOUS

## 2020-08-27 MED ORDER — FENTANYL CITRATE (PF) 100 MCG/2ML IJ SOLN
50.0000 ug | INTRAMUSCULAR | Status: DC | PRN
  Filled 2020-08-27: qty 4

## 2020-08-27 MED ORDER — ETOMIDATE 2 MG/ML IV SOLN: 20.0000 mg | Freq: Once | INTRAVENOUS | Status: DC

## 2020-08-27 MED ORDER — LACTATED RINGERS IV SOLN: INTRAVENOUS | Status: DC

## 2020-08-27 MED ORDER — LACTATED RINGERS IV BOLUS
20.0000 mL/kg | Freq: Once | INTRAVENOUS | Status: AC
  Administered 2020-08-27: 1000 mL via INTRAVENOUS

## 2020-08-27 MED ORDER — CLEVIDIPINE BUTYRATE 0.5 MG/ML IV EMUL: 0.0000 mg/h | INTRAVENOUS | Status: DC

## 2020-08-27 MED ORDER — ROCURONIUM BROMIDE 10 MG/ML (PF) SYRINGE: 80.0000 mg | PREFILLED_SYRINGE | Freq: Once | INTRAVENOUS | Status: DC

## 2020-08-27 MED ORDER — FENTANYL CITRATE (PF) 100 MCG/2ML IJ SOLN
50.0000 ug | Freq: Once | INTRAMUSCULAR | Status: AC
  Administered 2020-08-27: 50 ug via INTRAVENOUS

## 2020-08-27 MED ORDER — SODIUM CHLORIDE 3 % IV SOLN
INTRAVENOUS | Status: DC
  Filled 2020-08-27 (×2): qty 500

## 2020-08-27 MED ORDER — INSULIN REGULAR(HUMAN) IN NACL 100-0.9 UT/100ML-% IV SOLN
INTRAVENOUS | Status: DC
  Administered 2020-08-27: 11 [IU]/h via INTRAVENOUS
  Filled 2020-08-27 (×2): qty 100

## 2020-08-27 MED ORDER — PANTOPRAZOLE SODIUM 40 MG IV SOLR
40.0000 mg | Freq: Every day | INTRAVENOUS | Status: DC
  Administered 2020-08-27 – 2020-08-29 (×3): 40 mg via INTRAVENOUS
  Filled 2020-08-27 (×3): qty 40

## 2020-08-27 MED ORDER — POLYETHYLENE GLYCOL 3350 17 G PO PACK: 17.0000 g | PACK | Freq: Every day | ORAL | Status: DC | PRN

## 2020-08-27 MED ORDER — ACETAMINOPHEN 500 MG PO TABS: 500.0000 mg | ORAL_TABLET | Freq: Four times a day (QID) | ORAL | Status: DC | PRN

## 2020-08-27 MED ORDER — SODIUM CHLORIDE 0.9 % IV SOLN
2000.0000 mg | Freq: Once | INTRAVENOUS | Status: AC
  Administered 2020-08-27: 2000 mg via INTRAVENOUS
  Filled 2020-08-27: qty 20

## 2020-08-27 MED ORDER — ASPIRIN 325 MG PO TABS: 325.0000 mg | ORAL_TABLET | Freq: Every day | ORAL | Status: DC

## 2020-08-27 MED ORDER — ATORVASTATIN CALCIUM 40 MG PO TABS: 80.0000 mg | ORAL_TABLET | Freq: Every day | ORAL | Status: DC

## 2020-08-27 MED ORDER — PROPOFOL 1000 MG/100ML IV EMUL
5.0000 ug/kg/min | INTRAVENOUS | Status: DC
  Administered 2020-08-27 – 2020-08-28 (×2): 30 ug/kg/min via INTRAVENOUS
  Filled 2020-08-27 (×3): qty 100

## 2020-08-27 MED ORDER — NICARDIPINE HCL IN NACL 20-0.86 MG/200ML-% IV SOLN
3.0000 mg/h | INTRAVENOUS | Status: DC
  Administered 2020-08-27: 5 mg/h via INTRAVENOUS
  Filled 2020-08-27: qty 200

## 2020-08-27 MED ORDER — SODIUM CHLORIDE 0.9% FLUSH
10.0000 mL | Freq: Two times a day (BID) | INTRAVENOUS | Status: DC
  Administered 2020-08-27 – 2020-08-28 (×2): 30 mL
  Administered 2020-08-28 – 2020-09-01 (×7): 10 mL
  Administered 2020-09-02: 20 mL

## 2020-08-27 MED ORDER — ONDANSETRON HCL 4 MG/2ML IJ SOLN: 4.0000 mg | Freq: Four times a day (QID) | INTRAMUSCULAR | Status: DC | PRN

## 2020-08-27 MED ORDER — ORAL CARE MOUTH RINSE
15.0000 mL | OROMUCOSAL | Status: DC
  Administered 2020-08-27 – 2020-09-02 (×63): 15 mL via OROMUCOSAL

## 2020-08-27 MED ORDER — ACETAMINOPHEN 500 MG PO TABS
500.0000 mg | ORAL_TABLET | Freq: Four times a day (QID) | ORAL | Status: DC | PRN
Start: 2020-08-27 — End: 2020-09-02
  Administered 2020-08-28 (×2): 500 mg via NASOGASTRIC
  Filled 2020-08-27 (×2): qty 1

## 2020-08-27 NOTE — ED Provider Notes (Addendum)
[facility] EMERGENCY DEPARTMENT Provider Note   CSN: [id number] Arrival date & time: 2020-08-28  0058     History Chief Complaint  Patient presents with   unresponsive    Samantha [name] is a 60 y.o. female.  The history is provided by the EMS personnel. The history is limited by the condition of the patient.  Loss of Consciousness Episode history:  Single Most recent episode:  2 days ago Duration:  48 hours Timing:  Constant Progression:  Unchanged Chronicity:  New Context comment:  Found down in the bathroom Witnessed: no   Relieved by:  Nothing Worsened by:  Nothing Ineffective treatments:  None tried Associated symptoms: no fever   Risk factors: vascular disease   Patient with DM and HTN and previous aneurysm who was LSN 2 days ago found down in the bathroom by family, unresponsive.      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)    Hypercholesteremia    Hypertension     Patient Active Problem List   Diagnosis Date Noted   Heart murmur 11/26/2013   Hypercholesteremia    Hypertension    DM w/o complication type II [facility])     Past Surgical History:  Procedure Laterality Date   BRAIN SURGERY     States they went through her stomach to repair  bleed in her head in '05   CEREBRAL ANEURYSM REPAIR     in 2005     OB History     Gravida  0   Para      Term      Preterm      AB      Living         SAB      IAB      Ectopic      Multiple      Live Births              Family History  Problem Relation Age of Onset   Hypertension Mother    Heart failure Mother    CVA Mother    Diabetes Father    Heart disease Father    Heart attack Father    Aneurysm Sister    Cancer Brother    Cancer Brother    Other Neg Hx     Social History   Tobacco Use   Smoking status: Never   Smokeless tobacco: Never  Substance Use Topics   Alcohol use: No   Drug use: No     Home Medications Prior to Admission medications   Medication Sig Start Date End Date Taking? Authorizing Provider  aspirin EC 81 MG tablet Take 81 mg by mouth See admin instructions. Take 1 tablet (81 mg) daily, take an additional 1 tablet daily if needed for pain 10/24/11   Brent Bulla, MD  cloNIDine (CATAPRES - DOSED IN MG/24 HR) 0.2 mg/24hr patch APPLY 1 PATCH TOPICALLY ONCE A WEEK 09/19/17   [provider]  ibuprofen (ADVIL,MOTRIN) 400 MG tablet Take 1 tablet (400 mg total) by mouth every 6 (six) hours as needed. 07/29/14   Linwood Dibbles, MD  lisinopril-hydrochlorothiazide (ZESTORETIC) 20-12.5 MG tablet Take 1 tablet by mouth daily. 10/23/15   Hayden Rasmussen, NP  metFORMIN (GLUCOPHAGE) 1000 MG tablet Take one tablet po with evening meal 10/23/15   Hayden Rasmussen, NP  simvastatin (ZOCOR) 20 MG tablet Take 20 mg by mouth.  11/03/13   [provider]    Allergies    Patient has no known allergies.  Review of Systems   Review of Systems  Unable to perform ROS: Acuity of condition  Constitutional:  Negative for fever.  HENT:  Negative for facial swelling.   Respiratory:  Negative for stridor.   Cardiovascular:  Positive for syncope. Negative for leg swelling.  Skin:  Negative for rash.  Neurological:  Positive for syncope.   Physical Exam Updated Vital Signs BP (!) 185/102   Pulse (!) 120   Temp 97.6 F (36.4 C) (Rectal)   Resp 18   SpO2 100%   Physical Exam Vitals and nursing note reviewed. Exam conducted with a chaperone present.  Constitutional:      Appearance: She is not diaphoretic.  HENT:     Head: Normocephalic and atraumatic.     Right Ear: Tympanic membrane normal.     Left Ear: Tympanic membrane normal.     Nose: Nose normal.     Mouth/Throat:     Pharynx: No oropharyngeal exudate.  Eyes:     General:        Right eye: Discharge present.        Left eye: Discharge present.    Comments: [place] 4 L 2 R  Cardiovascular:     Rate and Rhythm:  Regular rhythm. Tachycardia present.     Pulses: Normal pulses.     Heart sounds: Normal heart sounds.  Pulmonary:     Effort: Pulmonary effort is normal.     Breath sounds: Normal breath sounds.  Abdominal:     General: Abdomen is flat. Bowel sounds are normal.     Palpations: Abdomen is soft.     Tenderness: There is no guarding or rebound.  Musculoskeletal:     Right lower leg: No edema.     Left lower leg: No edema.  Lymphadenopathy:     Cervical: No cervical adenopathy.  Skin:    General: Skin is warm and dry.     Capillary Refill: Capillary refill takes less than 2 seconds.  Neurological:     GCS: GCS eye subscore is 1. GCS verbal subscore is 1. GCS motor subscore is 2.     Comments: Right side appears flaccid   Psychiatric:     Comments: Unable     ED Results / Procedures / Treatments   Labs (all labs ordered are listed, but only abnormal results are displayed) Results for orders placed or performed during the hospital encounter of 08/14/2020  CBC WITH DIFFERENTIAL  Result Value Ref Range   WBC 19.3 (H) 4.0 - 10.5 K/uL   RBC 5.71 (H) 3.87 - 5.11 MIL/uL   Hemoglobin 15.7 (H) 12.0 - 15.0 g/dL   HCT 72.0 (H) 94.7 - 09.6 %   MCV 89.7 80.0 - 100.0 fL   MCH 27.5 26.0 - 34.0 pg   MCHC 30.7 30.0 - 36.0 g/dL   RDW 28.3 66.2 - 94.7 %   Platelets 394 150 - 400 K/uL   nRBC 0.0 0.0 - 0.2 %   Neutrophils Relative % 90 %   Neutro Abs 17.4 (H) 1.7 - 7.7 K/uL   Lymphocytes Relative 3 %   Lymphs Abs 0.6 (L) 0.7 - 4.0 K/uL   Monocytes Relative 6 %   Monocytes Absolute 1.1 (H) 0.1 - 1.0 K/uL   Eosinophils Relative 0 %   Eosinophils Absolute 0.0 0.0 - 0.5 K/uL   Basophils Relative 0 %  Basophils Absolute 0.0 0.0 - 0.1 K/uL   Immature Granulocytes 1 %   Abs Immature Granulocytes 0.14 (H) 0.00 - 0.07 K/uL  CBG monitoring, ED  Result Value Ref Range   Glucose-Capillary >600 (HH) 70 - 99 mg/dL  I-stat chem 8, ED ([facility])  Result Value Ref Range   Sodium 146 (H) 135  - 145 mmol/L   Potassium 4.2 3.5 - 5.1 mmol/L   Chloride 111 98 - 111 mmol/L   BUN 68 (H) 6 - 20 mg/dL   Creatinine, Ser 1.612.00 (H) 0.44 - 1.00 mg/dL   Glucose, Bld >096>700 (HH) 70 - 99 mg/dL   Calcium, Ion 0.451.07 (L) 1.15 - 1.40 mmol/L   TCO2 23 22 - 32 mmol/L   Hemoglobin 17.3 (H) 12.0 - 15.0 g/dL   HCT 40.951.0 (H) 81.136.0 - 91.446.0 %   Comment NOTIFIED PHYSICIAN    No results found.  EKG  EKG Interpretation  Date/Time:  Sunday August 27 2020 01:10:28 EDT Ventricular Rate:  111 PR Interval:  158 QRS Duration: 80 QT Interval:  358 QTC Calculation: 487 R Axis:   -51 Text Interpretation: Sinus tachycardia Probable left atrial enlargement LVH with secondary repolarization abnormality Inferior infarct, old Anterior infarct, old Confirmed by Nicanor AlconPalumbo, Alexcia Schools ([id number]) on 20-Nov-2020 2:13:21 AM         Radiology No results found.  Procedures Procedure Name: Intubation Date/Time: 20-Nov-2020 2:15 AM Performed by: Cy BlamerPalumbo, Maloree Uplinger, MD Pre-anesthesia Checklist: Patient identified, Patient being monitored, Emergency Drugs available, Timeout performed and Suction available Oxygen Delivery Method: Non-rebreather mask Preoxygenation: Pre-oxygenation with 100% oxygen Induction Type: Rapid sequence Ventilation: Mask ventilation without difficulty Laryngoscope Size: Glidescope and 4 Grade View: Grade II Tube size: 7.5 mm Number of attempts: 1 Airway Equipment and Method: Patient positioned with wedge pillow Placement Confirmation: ETT inserted through vocal cords under direct vision, CO2 detector and Breath sounds checked- equal and bilateral Secured at: 21 cm Tube secured with: ETT holder Dental Injury: Teeth and Oropharynx as per pre-operative assessment  Difficulty Due To: Difficulty was anticipated      Medications Ordered in ED Medications  nicardipine (CARDENE) 20mg  in 0.86% saline 200ml IV infusion (0.1 mg/ml) ( Intravenous Not Given 2020-08-06 0216)  succinylcholine (ANECTINE) 200 MG/10ML  syringe (has no administration in time range)  rocuronium bromide 10 mg/mL (PF) syringe (has no administration in time range)  propofol (DIPRIVAN) 1000 MG/100ML infusion (20 mcg/kg/min Intravenous New Bag/Given 2020-08-06 0148)  levETIRAcetam (KEPPRA) 2,000 mg in sodium chloride 0.9 % 250 mL IVPB (has no administration in time range)  lactated ringers bolus 20 mL/kg (has no administration in time range)  insulin regular, human (MYXREDLIN) 100 units/ 100 mL infusion (has no administration in time range)  lactated ringers infusion (has no administration in time range)  dextrose 5 % in lactated ringers infusion (has no administration in time range)  dextrose 50 % solution 0-50 mL ( Intravenous Not Given 2020-08-06 0217)  etomidate (AMIDATE) injection (20 mg Intravenous Given 2020-08-06 0138)  rocuronium (ZEMURON) injection (80 mg Intravenous Given 2020-08-06 0139)     ED Course  I have reviewed the triage vital signs and the nursing notes.  Pertinent labs & imaging results that were available during my care of the patient were reviewed by me and considered in my medical decision making (see chart for details).   MDM Reviewed: previous chart, nursing note and vitals Interpretation: labs, ECG, x-ray and CT scan (hyperglycemia, renal insufficiency, stroke by me on CT) Total time  providing critical care: 75-105 minutes (insulin drip and propofol, cardene drip for BP). This excludes time spent performing separately reportable procedures and services. Consults: critical care and neurology CRITICAL CARE Performed by: Panzy Bubeck K Guenevere Roorda-Rasch Total critical care time: 105 minutes Critical care time was exclusive of separately billable procedures and treating other patients. Critical care was necessary to treat or prevent imminent or life-threatening deterioration. Critical care was time spent personally by me on the following activities: development of treatment plan with patient and/or surrogate as well as  nursing, discussions with consultants, evaluation of patient's response to treatment, examination of patient, obtaining history from patient or surrogate, ordering and performing treatments and interventions, ordering and review of laboratory studies, ordering and review of radiographic studies, pulse oximetry and re-evaluation of patient's condition.   MDM Rules/Calculators/A&P                         Koula Venier was evaluated in Emergency Department on 08/11/2020 for the symptoms described in the history of present illness. She was evaluated in the context of the global COVID-19 pandemic, which necessitated consideration that the patient might be at risk for infection with the SARS-CoV-2 virus that causes COVID-19. Institutional protocols and algorithms that pertain to the evaluation of patients at risk for COVID-19 are in a state of rapid change based on information released by regulatory bodies including the CDC and federal and state organizations. These policies and algorithms were followed during the patient's care in the ED.  Final Clinical Impression(s) / ED Diagnoses Final diagnoses:  Cerebrovascular accident (CVA), unspecified mechanism (HCC)  Respiratory arrest [facility])    Admit to ICU      Adhrit Krenz, MD 09/03/2020 5038

## 2020-08-27 NOTE — ED Triage Notes (Signed)
Pt here by [facility]; pt was found by family unresponsive on the floor after not hearing from her for 2 days; hx of DM, HTN and aneurysm; upon EMS arrival CBG less than 20, was given 25g D10 and CBG increased to 113; 206/110, 99%RA and HR 100; EDP at bedside for [facility]

## 2020-08-27 NOTE — Progress Notes (Signed)
Pharmacy Antibiotic Note  Samantha [name] is a 60 y.o. female admitted on September 15, 2020 with sepsis.  Pharmacy has been consulted for cefepime dosing.  Plan: Cefepime 2gm IV q24 hours F/u renal function, cultures and clinical course  Height: 5\' 1"  (154.9 cm) Weight: 86.2 kg (190 lb) IBW/kg (Calculated) : 47.8  Temp (24hrs), Avg:98.4 F (36.9 C), Min:96.4 F (35.8 C), Max:100.8 F (38.2 C)  Recent Labs  Lab 15-Sep-2020 0116 Sep 15, 2020 0150 2020/09/15 0441 09-15-2020 0555 09-15-20 0909 Sep 15, 2020 0934 09/15/2020 1340  WBC 19.3*  --   --   --  20.9*  --   --   CREATININE 2.15* 2.00* 1.86* 1.84* 1.82*  --  1.33*  LATICACIDVEN  --   --   --   --   --  3.5*  --     Estimated Creatinine Clearance: 46.6 mL/min (A) (by C-G formula based on SCr of 1.33 mg/dL (H)).    No Known Allergies   Thank you for allowing pharmacy to be a part of this patient's care.  08/29/20 [place] 15-Sep-2020 11:47 PM

## 2020-08-27 NOTE — Progress Notes (Addendum)
eLink Physician-Brief Progress Note Patient Name: Samantha [name] DOB: November 19, 1960 MRN: [id number]   Date of Service  08/25/2020  HPI/Events of Note  Pt temp trending up, now 100.8. RN said she put ice packs on the patient an hour ago and not working. She does not have Tylenol ordered.   critically ill due to large left MCA stroke, hyperosmolar hyperglycemic state, rhabdomyolysis, AKI, leukocytosis, cerebral edema, seizure, respiratory failure  Wbc 20 K, on rocephin. Cr 1.3 LA 3.5   eICU Interventions  - tylenol ordered, Has OG tube. On Vent for encephalopathy. 8/21:  no pneumonia. On 40% fio2.  - get CxR for any evolving asp pneumonia? - UA neg. - Blood cx + for klebsiella, and enterobacter.  DC rocephine , to go on Cefpime.      Intervention Category Intermediate Interventions: Other:  Ranee Gosselin 09/04/2020, 11:35 PM  01:29 K low 3.3, Cr improving to 1.3 - kcl 10 /hr x 3 doses ordered. Follow labs in am. CxR images seen: possible new rt lower zone air space densities. LA down from 3.5 to 2.2  Continue care. On cefpime for now.

## 2020-08-27 NOTE — Progress Notes (Addendum)
STROKE TEAM PROGRESS NOTE   ATTENDING NOTE: I reviewed above note and agree with the assessment and plan. Pt was seen and examined.   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  CT showed large established left MCA infarct.  Loaded with Keppra 2 g.  Put on long-term EEG.  Found to have severely elevated glucose, hypertension and tachycardia as well as leukocytosis.  Put on insulin drip.  A1c 12.1, LDL 158, ammonia 28, UDS negative.  Creatinine 1.86.  On exam, patient niece at bedtime who is the next of kin, patient intubated just off sedation, eyes closed, not following commands. With forced eye opening, eyes in need position, not blinking to visual threat, doll's eyes sluggish but present, not tracking, pupil equal size 2.5 mm, sluggish to light. Corneal reflex weakly present bilaterally, gag and cough present. Breathing over the vent.  Facial symmetry not able to test due to ET tube.  Tongue protrusion not cooperative. On pain stimulation, withdraw left upper and lower extremity, but no movement of right upper and lower extremity. DTR 1+ and no babinski. Sensation, coordination and gait not tested.  Etiology for patient stroke not quite clear, however concerning for multiple uncontrolled stroke risk factors including uncontrolled diabetes, hypertension, and hyperlipidemia.  However, cardioembolic source cannot be ruled out at this time.  Put on aspirin 325 and Lipitor 80.  Continue Keppra 500 mg twice daily, follow-up with EEG.  Patient came in with respiratory failure, severe hyperglycemia, HHS, dehydration, rhabdomyolysis, AKI, and leukocytosis, CCM is managing.  Patient on long-term EEG, not able to have MRI/MRA at this time.  Repeat CT showed large left MCA infarct without HT or progressive swelling,  midline shift stable at 4 mm.  Carotid Doppler pending, LE venous Doppler pending.  Was put on 3% saline overnight, however due to pseudohyponatremia in the setting of severe hyperglycemia, corrected sodium level now it is around 160, will DC 3% saline, continue sodium monitoring. BP goal < 180/105.  I had long discussion with niece at bedside, updated pt current condition, treatment plan and potential poor prognosis, and answered all the questions.  She expressed understanding and appreciation.  She stated that patient definitely does not want in a vegetative state, however she has difficulty making decision at this time, she would like to see day by day condition before making decision.  For detailed assessment and plan, please refer to above as I have made changes wherever appropriate.   Marvel Plan, MD PhD Stroke Neurology 08/17/2020 1:24 PM  This patient is critically ill due to large left MCA stroke, hyperosmolar hyperglycemic state, rhabdomyolysis, AKI, leukocytosis, cerebral edema, seizure, respiratory failure and at significant risk of neurological worsening, death form brain herniation, hemorrhagic conversion, status epilepticus. This patient's care requires constant monitoring of vital signs, hemodynamics, respiratory and cardiac monitoring, review of multiple databases, neurological assessment, discussion with family, other specialists and medical decision making of high complexity. I spent 30 minutes of neurocritical care time in the care of this patient.   Interval History   No acute events overnight, she remains intubated and sedated.   Neurological examination pertinent for right sided hemiplegia, sluggish dolls eyes, gag intact.   Sister and niece updated at bedside. Sister states that prior to admission the patient told her that she would not want to be resuscitated if her heart were to stop  and that she would want for her sister to determine how long for her to remain on a  ventilator.   Pertinent Lab Work and Imaging    08-28-2020 CT Head WO IV Contrast Acute left MCA territory infarct without hemorrhagic conversion or progressive swelling. Midline shift measures 4 mm.  Physical Examination   Constitutional: Intubated  Cardiovascular: Normal RR Respiratory: On ventilator   Mental status: Intubated, sedated, can not respond to LOC questions, does not follow commands  Speech: UTA Cranial nerves: Right pupil 2, reactive, left pupil 2 sluggish, + dolls eyes, UTA facial symmetry due to ET tube Motor: Normal bulk and tone. No drift.  Sensory: Decreased sensation to RUE and RLE  Coordination: UTA  Gait: Deferred given patient acuity   Assessment and Plan   Ms. Shammara Jarrett is a 60 y.o. female w/pmh of DM2, hx of aneurysmal SAH of R Pcom in 2005 s/p endovascular treatment, HLD, HTN who was found down in the bathroom by family, found to have a large L MCA stroke.   #L MCAStroke  Patient presented with the symptoms described above. At this time, stroke work up is ongoing. When she presented she was noted to have seizure activity thus she was placed on EEG LTM and Keppra was initiated for seizure prophylaxis. Stroke work up is pending- needs to come off EEG monitoring. Will follow up on MRI Head, MRA Head and Neck, CUS, BLE [place], Echo. Stroke labs w/LDL 158, hemoglobin a1c 12.1.  - Aspirin 81 mg + Atorvastatin 80 for stroke prevention  - At discharge place ambulatory referral for stroke follow up   #pseudohyponatremia Due to sever hyperglycemia Na level will arise after correction of hypoglycemia Will adjust 3% saline according to sodium level  #Hypertension She has a history of HTN and takes lisinopril-hctz, clonidine patch at home. No need for permissive hypertension at this time given LKW was 2 days ago.  #Hyperlipidemia From a stroke prevention stand point, the LDL goal is < 70. LDL is 158, recommend to stop home Simvastatin and transition to  Atorvastatin 80 mg.   #Stroke Dysphagia Screening Swallow will be assessed when she is extubated   #DMII with HHS Hemoglobin A1C this admission noted to be uncontrolled at 12.1. Agree with management with insulin gtt at this time and SSI later.   Hospital day # 0  Stark Jock, NP  [facility] Nurse Practitioner Patient seen and discussed with attending physician Dr. Roda Shutters    To contact Stroke Continuity provider, please refer to [email or web address]. After hours, contact General Neurology

## 2020-08-27 NOTE — Consult Note (Signed)
NEUROLOGY CONSULTATION NOTE   Date of service: August 27, 2020 Patient Name: Samantha [name] MRN:  [id number] DOB:  1960-07-08 Reason for consult: "L MCA stroke" Requesting Provider: Deland Pretty, * _ _ _   _ __   _ __ _ _  __ __   _ __   __ _  History of Present Illness  Samantha [name] is a 60 y.o. 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 LKW 2 days ago. On arrival, noted to be unresponsive, not moving her Right side with unequal pupils and twitching on the Right and twitching movements of her eyes.  She was intubated with etomidate and rocuronium prior to my evaluation and a STAT CTH was obtained which demonstrates a large Left MCA stroke.  She is unable to provide any history secondary to her intubation/sedation and large stroke.  mRS: 0 tPA: outside window Thrombectomy: The stroke appears to be completed on the CT Head NIHSS components Score: Comment  1a Level of Conscious 0'[]'  1'[]'  2'[]'  3'[x]'      1b LOC Questions 0'[]'  1'[]'  2'[x]'       1c LOC Commands 0'[]'  1'[]'  2'[x]'       2 Best Gaze 0'[x]'  1'[]'  2'[]'       3 Visual 0'[]'  1'[]'  2'[]'  3'[x]'      4 Facial Palsy 0'[]'  1'[]'  2'[x]'  3'[]'      5a Motor Arm - left 0'[]'  1'[]'  2'[]'  3'[]'  4'[]'  UN'[x]'    5b Motor Arm - Right 0'[]'  1'[]'  2'[]'  3'[]'  4'[]'  UN'[x]'    6a Motor Leg - Left 0'[]'  1'[]'  2'[]'  3'[]'  4'[]'  UN'[x]'    6b Motor Leg - Right 0'[]'  1'[]'  2'[]'  3'[]'  4'[]'  UN'[x]'    7 Limb Ataxia 0'[]'  1'[]'  2'[]'  3'[]'  UN'[x]'     8 Sensory 0'[]'  1'[]'  2'[]'  UN'[x]'      9 Best Language 0'[]'  1'[]'  2'[]'  3'[x]'      10 Dysarthria 0'[]'  1'[]'  2'[]'  UN'[x]'      11 Extinct. and Inattention 0'[]'  1'[]'  2'[x]'       TOTAL: 17(intubated)       ROS   Unable to obtain ROS, PMH, PSH, Fhx, Shx, 2/2 intubation and sedation.  Past History   Past Medical History:  Diagnosis Date  . Brain aneurysm   . DM w/o complication type II ([facility])   . Hypercholesteremia   . Hypertension    Past Surgical History:  Procedure Laterality  Date  . BRAIN SURGERY     States they went through her stomach to repair  bleed in her head in '05  . CEREBRAL ANEURYSM REPAIR     in 2005   Family History  Problem Relation Age of Onset  . Hypertension Mother   . Heart failure Mother   . CVA Mother   . Diabetes Father   . Heart disease Father   . Heart attack Father   . Aneurysm Sister   . Cancer Brother   . Cancer Brother   . Other Neg Hx    Social History   Socioeconomic History  . Marital status: Widowed    Spouse name: Not on file  . Number of children: Not on file  . Years of education: Not on file  . Highest education level: Not on file  Occupational History  . Not on file  Tobacco Use  . Smoking status: Never  . Smokeless tobacco: Never  Substance and Sexual Activity  .  Alcohol use: No  . Drug use: No  . Sexual activity: Never    Birth control/protection: None  Other Topics Concern  . Not on file  Social History Narrative  . Not on file   Social Determinants of Health   Financial Resource Strain: Not on file  Food Insecurity: Not on file  Transportation Needs: Not on file  Physical Activity: Not on file  Stress: Not on file  Social Connections: Not on file   No Known Allergies  Medications  (Not in a hospital admission)    Vitals   Vitals:   08/09/2020 0115 08/11/2020 0143 08/29/2020 0208  BP: (!) 218/94 (!) 185/102   Pulse: (!) 106 (!) 120   Resp: 20 18   Temp: 97.6 F (36.4 C)    TempSrc: Rectal    SpO2: 98% 100% 100%     There is no height or weight on file to calculate BMI.  Physical Exam   General: Laying in bed; intubated. HENT: Normal oropharynx and mucosa. Normal external appearance of ears and nose. Neck: Supple, no pain or tenderness CV: No JVD. No peripheral edema. Pulmonary: Symmetric Chest rise. Not breathing over vent. Abdomen: Soft to touch, non-tender. Ext: No cyanosis, edema, or deformity Skin: No rash. Normal palpation of skin.  Musculoskeletal: Normal digits and  nails by inspection. No clubbing.  Neurologic Examination about 35 mins after rocuronium and on propofol  Mental status/Cognition: No response to loud voice or clap. No grimace to nares stimulation.  Brainstem reflexes: Cough: absent Gag: absent Pupils 27m BL, round and reactive to light Corneals: absent   Motor:  Muscle bulk: normal, tone flaccid in all extremities. No response to pain in any extremities. No spontaneous movement noted on my evaluation.  Reflexes:  Right Left Comments  Pectoralis      Biceps (C5/6) 0 0   Brachioradialis (C5/6) 1 1    Triceps (C6/7) 0 0    Patellar (L3/4) 0 0    Achilles (S1)      Hoffman      Plantar     Jaw jerk     Coordination/Complex Motor:  Unable to assess.  Labs   CBC:  Recent Labs  Lab 08/25/2020 0116 08/14/2020 0150 08/26/2020 0218  WBC 19.3*  --   --   NEUTROABS 17.4*  --   --   HGB 15.7* 17.3* 15.6*  HCT 51.2* 51.0* 46.0  MCV 89.7  --   --   PLT 394  --   --     Basic Metabolic Panel:  Lab Results  Component Value Date   NA 148 (H) 08/11/2020   K 3.7 08/31/2020   CO2 25 01/05/2009   GLUCOSE >700 (HH) 08/22/2020   BUN 68 (H) 08/30/2020   CREATININE 2.00 (H) 08/08/2020   CALCIUM 9.0 01/05/2009   GFRNONAA >60 01/05/2009   GFRAA  01/05/2009    >60        The eGFR has been calculated using the MDRD equation. This calculation has not been validated in all clinical situations. eGFR's persistently <60 mL/min signify possible Chronic Kidney Disease.   Lipid Panel:  Lab Results  Component Value Date   LDLCALC 57 06/08/2007   HgbA1c: No results found for: HGBA1C Urine Drug Screen: No results found for: LABOPIA, COCAINSCRNUR, LABBENZ, AMPHETMU, THCU, LABBARB  Alcohol Level     Component Value Date/Time   [facility] 01/05/2009 1349    <5        LOWEST DETECTABLE LIMIT  FOR SERUM ALCOHOL IS 5 mg/dL FOR MEDICAL PURPOSES ONLY   CT Head without contrast: Large Left MCA stroke.  MR Angio head without contrast and  Carotid Duplex BL: pending  MRI Brain: pending Impression   Samantha [name] is a 60 y.o. 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 Found to have a large L MCA stroke with cerebral edema. She is outside the window for any intervention and her stroke appears completed. She was also noted to have twitching movements concerning for a seizure.  Primary Diagnosis:  Cerebral infarction due to embolism of  left middle cerebral artery.   Secondary Diagnosis: Cerebral edema and Type 2 diabetes mellitus with hyperglycemia  Seizures  Recommendations  Plan:  Large Left MCA stroke with Cerebral edema: - Admit to ICU under PCCM - Frequent Neuro checks per stroke unit protocol - Brain imaging with MRI Brain without contrast - Vascular imaging with MRA Angio Head without contrast and US Carotid doppler - TTE with bubble study - Lipid panel with LDL - Please start statin if LDL > 70 - HbA1c - Antithrombotic - aspirin 26m daily - DVT ppx - SBP goal - modified permissive hypertension with goal SBP < 180 given the large area of the infarct. - Telemetry monitoring for arrythmia - Bedside swallow screen prior to PO intake. - Stroke education booklet - PT/OT/SLP consult - I ordered Hypertonic saline 3% at 736mhr. Goal Na of 150-160. - I ordered repeat CTMosbyn AM. - Hemicrani watch and recommend consulting Neurosurgery team in AM  Seizures: Potentially from the L MCA stroke vs hyperglycemia. - Keppra 2G Iv once given in the ED and started on propofol 20 for sedation - Will get cEEG started tonight. ______________________________________________________________________  This patient is critically ill and at significant risk of neurological worsening, death and care requires constant monitoring of vital signs, hemodynamics,respiratory and cardiac monitoring, neurological  assessment, discussion with family, other specialists and medical decision making of high complexity. I spent 50 minutes of neurocritical care time  in the care of  this patient. This was time spent independent of any time provided by nurse practitioner or PA.  [facility] Pager Number [phone]/21/2022  3:21 AM  Plan discussed with Dr. DoEarlie Servernd with Dr. PaRandal BubaThank you for the opportunity to take part in the care of this patient. If you have any further questions, please contact the neurology consultation attending.  Signed,  SaBlawenburgager Number [phone] _ _   _ __   _ __ _ _  __ __   _ __   __ _

## 2020-08-27 NOTE — Progress Notes (Signed)
Transport to CT and back to ICU. NO noted respiratory issues at this time.

## 2020-08-27 NOTE — Progress Notes (Signed)
PHARMACY - PHYSICIAN COMMUNICATION CRITICAL VALUE ALERT - BLOOD CULTURE IDENTIFICATION (BCID)  Samantha [name] is an 60 y.o. female who presented to [facility] on 08/14/2020 with a chief complaint of unresponsive at home and large CVA.  Assessment:  1 out of 4 blood cultures with GNR. BCID showing Klebsiella pneumoniae (no resistance detected). WBC elevated at 20.9, DKA, and low temperature on admission.  Name of physician (or Provider) Contacted: Norton Blizzard, CCM NP  Current antibiotics: None  Changes to prescribed antibiotics recommended:  Recommendations accepted by provider - add Ceftriaxone 2g IV every 24 hours.   Results for orders placed or performed during the hospital encounter of 08/30/2020  Blood Culture ID Panel (Reflexed) (Collected: 08/26/2020  2:45 AM)  Result Value Ref Range   Enterococcus faecalis NOT DETECTED NOT DETECTED   Enterococcus Faecium NOT DETECTED NOT DETECTED   Listeria monocytogenes NOT DETECTED NOT DETECTED   Staphylococcus species NOT DETECTED NOT DETECTED   Staphylococcus aureus (BCID) NOT DETECTED NOT DETECTED   Staphylococcus epidermidis NOT DETECTED NOT DETECTED   Staphylococcus lugdunensis NOT DETECTED NOT DETECTED   Streptococcus species NOT DETECTED NOT DETECTED   Streptococcus agalactiae NOT DETECTED NOT DETECTED   Streptococcus pneumoniae NOT DETECTED NOT DETECTED   Streptococcus pyogenes NOT DETECTED NOT DETECTED   A.calcoaceticus-baumannii NOT DETECTED NOT DETECTED   Bacteroides fragilis NOT DETECTED NOT DETECTED   Enterobacterales DETECTED (A) NOT DETECTED   Enterobacter cloacae complex NOT DETECTED NOT DETECTED   Escherichia coli NOT DETECTED NOT DETECTED   Klebsiella aerogenes NOT DETECTED NOT DETECTED   Klebsiella oxytoca NOT DETECTED NOT DETECTED   Klebsiella pneumoniae DETECTED (A) NOT DETECTED   Proteus species NOT DETECTED NOT DETECTED   Salmonella species NOT DETECTED NOT DETECTED   Serratia marcescens NOT DETECTED NOT  DETECTED   Haemophilus influenzae NOT DETECTED NOT DETECTED   Neisseria meningitidis NOT DETECTED NOT DETECTED   Pseudomonas aeruginosa NOT DETECTED NOT DETECTED   Stenotrophomonas maltophilia NOT DETECTED NOT DETECTED   Candida albicans NOT DETECTED NOT DETECTED   Candida auris NOT DETECTED NOT DETECTED   Candida glabrata NOT DETECTED NOT DETECTED   Candida krusei NOT DETECTED NOT DETECTED   Candida parapsilosis NOT DETECTED NOT DETECTED   Candida tropicalis NOT DETECTED NOT DETECTED   Cryptococcus neoformans/gattii NOT DETECTED NOT DETECTED   CTX-M ESBL NOT DETECTED NOT DETECTED   Carbapenem resistance IMP NOT DETECTED NOT DETECTED   Carbapenem resistance KPC NOT DETECTED NOT DETECTED   Carbapenem resistance NDM NOT DETECTED NOT DETECTED   Carbapenem resist OXA 48 LIKE NOT DETECTED NOT DETECTED   Carbapenem resistance VIM NOT DETECTED NOT DETECTED    [name] 08/25/2020  6:04 PM

## 2020-08-27 NOTE — Progress Notes (Signed)
  Echocardiogram 2D Echocardiogram has been performed.  Samantha [name] 08-29-2020, 1:18 PM

## 2020-08-27 NOTE — Progress Notes (Signed)
OGT advanced 5 cm per radiology.

## 2020-08-27 NOTE — H&P (Signed)
NAME:  Samantha [name], MRN:  [id number], DOB:  12/16/1960, LOS: 0 ADMISSION DATE:  08/09/2020, CONSULTATION DATE:  09/01/2020 REFERRING MD: Dr. Daun Peacock, CHIEF COMPLAINT:  ALOC   History of Present Illness:  60 year old black female that presented to the emergency room from home.  The patient was found down in her bathroom.  She is unconscious unresponsive.  Family had not heard from her for 2 days therefore they came to the house and saw her on the floor through the bathroom window.  911/EMS was called.  Patient was found to have significant respiratory distress not able to protect her airway therefore was intubated on arrival to the emergency room.  It was noted that her right arm and leg were completely flaccid.  Prior to intubation.  Patient has a long history of hypertension  Pertinent  Medical History  Hypertension Cerebral aneurysm Diabetes mellitus type 2 Hyperlipidemia    Significant Hospital Events: Including procedures, antibiotic start and stop dates in addition to other pertinent events      Objective   Blood pressure (!) 185/102, pulse (!) 120, temperature 97.6 F (36.4 C), temperature source Rectal, resp. rate 18, SpO2 100 %.    Vent Mode: PRVC FiO2 (%):  [60 %] 60 % Set Rate:  [18 bmp] 18 bmp Vt Set:  [400 mL] 400 mL PEEP:  [5 cmH20] 5 cmH20 Plateau Pressure:  [17 cmH20] 17 cmH20  No intake or output data in the 24 hours ending 08/15/2020 0226 Filed Weights    Examination: General: No acute distress HENT: Atraumatic/normocephalic, cervical collar in place.  No bony step-offs noted.  Mucous membranes are moist.  Patient is orally intubated. Lungs: Clear to auscultation bilaterally no wheezing rales or rhonchi noted. Cardiovascular: Regular rate no gallop or rub noted.  1/6 systolic ejection murmur. Abdomen: Soft, nondistended Extremities: Distal pulse intact x4.  No significant cyanosis or edema. Neuro: Unconscious/unresponsive still under the effects of  anesthesia and paralytics used for intubation. GU: Foley catheter was placed.    Assessment & Plan:  Acute respiratory failure Large left MCA CVA. Left middle cerebral artery embolism Severe hypertension Acute renal insufficiency  Plan: Patient be admitted to the intensive care unit for further work-up. Discussed the case with Dr. Derry Lory from neurology.  Started hypertonic saline. Repeat head CT in a.m. Started on Cardene infusion with a systolic blood pressure goal less than 180. N.p.o. Protonix for GI prophylaxis. Neurochecks every hour Lovenox for DVT prophylaxis Plan for EEG tonight.  AEM per neurology service. Closely monitor I's/O's.  Foley catheter was placed.  Avoid nephrotoxic medications if able. Insulin sliding scale  Best Practice (right click and "Reselect all SmartList Selections" daily)   Diet/type: NPO DVT prophylaxis: LMWH GI prophylaxis: PPI Lines: N/A Foley:  Yes, and it is still needed Code Status:  full code   Labs   CBC: Recent Labs  Lab 08/07/2020 0116 08/16/2020 0150 08/15/2020 0218  WBC 19.3*  --   --   NEUTROABS 17.4*  --   --   HGB 15.7* 17.3* 15.6*  HCT 51.2* 51.0* 46.0  MCV 89.7  --   --   PLT 394  --   --     Basic Metabolic Panel: Recent Labs  Lab 08/26/2020 0150 08/22/2020 0218  NA 146* 148*  K 4.2 3.7  CL 111  --   GLUCOSE >700*  --   BUN 68*  --   CREATININE 2.00*  --    GFR: CrCl cannot be calculated (  Unknown ideal weight.). Recent Labs  Lab 09-15-2020 0116  WBC 19.3*    Liver Function Tests: No results for input(s): AST, ALT, ALKPHOS, BILITOT, PROT, ALBUMIN in the last 168 hours. No results for input(s): LIPASE, AMYLASE in the last 168 hours. No results for input(s): AMMONIA in the last 168 hours.  ABG    Component Value Date/Time   PHART 7.366 2020/09/15 0218   PCO2ART 37.9 09/15/20 0218   PO2ART 273 (H) September 15, 2020 0218   HCO3 21.8 09-15-2020 0218   TCO2 23 2020-09-15 0218   ACIDBASEDEF 3.0 (H)  09/15/2020 0218   O2SAT 100.0 09/15/20 0218     Coagulation Profile: No results for input(s): INR, PROTIME in the last 168 hours.  Cardiac Enzymes: No results for input(s): CKTOTAL, CKMB, CKMBINDEX, TROPONINI in the last 168 hours.  HbA1C: No results found for: HGBA1C  CBG: Recent Labs  Lab Sep 15, 2020 0113  GLUCAP >600*    Review of Systems:   Unable to obtain due to neuro status  Past Medical History:  She,  has a past medical history of Brain aneurysm, DM w/o complication type II ([facility]), Hypercholesteremia, and Hypertension.   Surgical History:   Past Surgical History:  Procedure Laterality Date   BRAIN SURGERY     States they went through her stomach to repair  bleed in her head in '05   CEREBRAL ANEURYSM REPAIR     in 2005     Social History:   reports that she has never smoked. She has never used smokeless tobacco. She reports that she does not drink alcohol and does not use drugs.   Family History:  Her family history includes Aneurysm in her sister; CVA in her mother; Cancer in her brother and brother; Diabetes in her father; Heart attack in her father; Heart disease in her father; Heart failure in her mother; Hypertension in her mother. There is no history of Other.   Allergies No Known Allergies   Home Medications  Prior to Admission medications   Medication Sig Start Date End Date Taking? Authorizing Provider  aspirin EC 81 MG tablet Take 81 mg by mouth See admin instructions. Take 1 tablet (81 mg) daily, take an additional 1 tablet daily if needed for pain 10/24/11   Brent Bulla, MD  cloNIDine (CATAPRES - DOSED IN MG/24 HR) 0.2 mg/24hr patch APPLY 1 PATCH TOPICALLY ONCE A WEEK 09/19/17   [provider]  ibuprofen (ADVIL,MOTRIN) 400 MG tablet Take 1 tablet (400 mg total) by mouth every 6 (six) hours as needed. 07/29/14   Linwood Dibbles, MD  lisinopril-hydrochlorothiazide (ZESTORETIC) 20-12.5 MG tablet Take 1 tablet by mouth daily. 10/23/15   Hayden Rasmussen, NP  metFORMIN (GLUCOPHAGE) 1000 MG tablet Take one tablet po with evening meal 10/23/15   Hayden Rasmussen, NP  simvastatin (ZOCOR) 20 MG tablet Take 20 mg by mouth.  11/03/13   [provider]     Critical care time: 

## 2020-08-27 NOTE — Progress Notes (Signed)
Started cEEG study.  Notified [facility] monitoring.  Tested patient event button. 

## 2020-08-27 NOTE — Progress Notes (Signed)
NAME:  Samantha [name], MRN:  [id number], DOB:  01-May-1960, LOS: 0 ADMISSION DATE:  09/01/2020, CONSULTATION DATE:  08/30/2020 REFERRING MD: Dr. Daun Peacock, CHIEF COMPLAINT:  ALOC   History of Present Illness:  60 year old black female that presented to the emergency room from home.  The patient was found down in her bathroom.  She is unconscious unresponsive.  Family had not heard from her for 2 days therefore they came to the house and saw her on the floor through the bathroom window.  911/EMS was called.  Patient was found to have significant respiratory distress not able to protect her airway therefore was intubated on arrival to the emergency room.  It was noted that her right arm and leg were completely flaccid.  Prior to intubation.  Patient has a long history of hypertension.    Workup in the ED was significant for large L MCA territory infarct, likely subacute, without hemorrhage.  CT C-spine without acute findings.  Labs showed an initial glucose of >600, creatinine 2.15 (baseline <1), WBC 19k, CK 1130  Pertinent  Medical History  Hypertension Cerebral aneurysm Diabetes mellitus type 2 Hyperlipidemia  Significant Hospital Events: Including procedures, antibiotic start and stop dates in addition to other pertinent events   8/21 Brought in from home, intubated, admit to ICU, R IJ CVC placed 8/21 Initial head CT with large L MCA infarct, likely subacute   Objective   Blood pressure (!) 179/73, pulse (!) 101, temperature (!) 97.3 F (36.3 C), resp. rate (!) 21, height 5\' 1"  (1.549 m), weight 86.2 kg, SpO2 100 %.    Vent Mode: PRVC FiO2 (%):  [40 %-60 %] 40 % Set Rate:  [18 bmp] 18 bmp Vt Set:  [400 mL] 400 mL PEEP:  [5 cmH20] 5 cmH20 Plateau Pressure:  [17 cmH20] 17 cmH20   Intake/Output Summary (Last 24 hours) at 08/28/2020 0858 Last data filed at 09/01/2020 0600 Gross per 24 hour  Intake 1706.86 ml  Output 250 ml  Net 1456.86 ml   Filed Weights   08/21/2020 0334  Weight:  86.2 kg   General:  well-nourished F intubated and sedated HEENT: MM pink/moist, pupils equal, sclera anicteric  Neuro: examined on propofol, intermittent movement of the LUE and LLE, Right side is flacid,  CV: s1s2 rrr, no m/r/g PULM:  clear bilaterally, on full vent support GI: soft, bsx4 active  Extremities: warm/dry, no edema  Skin: no rashes or lesions     Assessment & Plan:   Acute respiratory failure in the setting of 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 -HOB elevated 30 degrees. -Plateau pressures less than 30 cm H20.  -Follow chest x-ray, ABG prn.   -Bronchial hygiene and RT/bronchodilator protocol.     Possible Seizure -received Keppra in the ED, continue propofol and continuous EEG    HHS  Glucose as high has 800, on Metformin at baseline P: -continue insulin gtt and serial BMP     Severe hypertension Initially  on Cardene gtt, now off and at goal of SBP <180 on propofol   Acute renal insufficiency Possible Rhabdomyolysis  CK  Baseline creatinine appears to be <1, initially over 2, down-trending slightly to 1.8 this AM P: -check repeat CK, continue Lactated ringers and monitor renal indices, electrolytes and UOP -consider renal US     Best Practice (right click and "Reselect all SmartList Selections" daily)   Diet/type: NPO DVT prophylaxis: LMWH GI prophylaxis: PPI Lines: Central line Foley:   Yes, and it is still needed Code Status:  full code   Labs   CBC: Recent Labs  Lab 08/29/2020 0116 08/31/2020 0150 08/29/2020 0218  WBC 19.3*  --   --   NEUTROABS 17.4*  --   --   HGB 15.7* 17.3* 15.6*  HCT 51.2* 51.0* 46.0  MCV 89.7  --   --   PLT 394  --   --      Basic Metabolic Panel: Recent Labs  Lab 08/30/2020 0116 08/22/2020 0150 08/18/2020 0218 09/05/2020 0441 09/01/2020 0555  NA 141 146* 148* 147*  148* 151*  K 4.1 4.2 3.7 4.9 3.7  CL 101 111  --  114* 117*  CO2 19*  --   --  18* 19*  GLUCOSE 793* >700*  --  677* 608*  BUN 59* 68*  --  57* 58*  CREATININE 2.15* 2.00*  --  1.86* 1.84*  CALCIUM 9.6  --   --  8.8* 9.3  MG  --   --   --   --  3.0*  PHOS  --   --   --   --  3.2    GFR: Estimated Creatinine Clearance: 33.7 mL/min (A) (by C-G formula based on SCr of 1.84 mg/dL (H)). Recent Labs  Lab 08/13/2020 0116  WBC 19.3*     Liver Function Tests: Recent Labs  Lab 08/09/2020 0116  AST 29  ALT 15  ALKPHOS 94  BILITOT 0.8  PROT 8.5*  ALBUMIN 3.8   No results for input(s): LIPASE, AMYLASE in the last 168 hours. Recent Labs  Lab 08/29/2020 0411  AMMONIA 28    ABG    Component Value Date/Time   PHART 7.366 08/17/2020 0218   PCO2ART 37.9 08/11/2020 0218   PO2ART 273 (H) 08/18/2020 0218   HCO3 21.8 08/07/2020 0218   TCO2 23 08/20/2020 0218   ACIDBASEDEF 3.0 (H) 08/13/2020 0218   O2SAT 100.0 08/07/2020 0218      Coagulation Profile: Recent Labs  Lab 08/18/2020 0441  INR 2.6*    Cardiac Enzymes: Recent Labs  Lab 08/16/2020 0116  CKTOTAL 1,130*    HbA1C: No results found for: HGBA1C  CBG: Recent Labs  Lab 08/31/2020 0556 09/03/2020 0624 08/29/2020 0653 08/13/2020 0737 08/26/2020 0854  GLUCAP 568* 490* 428* 396* 323*     Review of Systems:   Unable to obtain due to neuro status  Past Medical History:  She,  has a past medical history of Brain aneurysm, DM w/o complication type II (HCC), Hypercholesteremia, and Hypertension.   Surgical  History:   Past Surgical History:  Procedure Laterality Date   BRAIN SURGERY     States they went through her stomach to repair  bleed in her head in '05   CEREBRAL ANEURYSM REPAIR     in 2005     Social History:   reports that she has never smoked. She has never used smokeless tobacco. She reports that she does not drink alcohol and does not  use drugs.   Family History:  Her family history includes Aneurysm in her sister; CVA in her mother; Cancer in her brother and brother; Diabetes in her father; Heart attack in her father; Heart disease in her father; Heart failure in her mother; Hypertension in her mother. There is no history of Other.   Allergies No Known Allergies   Home Medications  Prior to Admission medications   Medication Sig Start Date End Date Taking? Authorizing Provider  aspirin EC 81 MG tablet Take 81 mg by mouth See admin instructions. Take 1 tablet (81 mg) daily, take an additional 1 tablet daily if needed for pain 10/24/11   Brent Bulla, MD  cloNIDine (CATAPRES - DOSED IN MG/24 HR) 0.2 mg/24hr patch APPLY 1 PATCH TOPICALLY ONCE A WEEK 09/19/17   [provider]  ibuprofen (ADVIL,MOTRIN) 400 MG tablet Take 1 tablet (400 mg total) by mouth every 6 (six) hours as needed. 07/29/14   Linwood Dibbles, MD  lisinopril-hydrochlorothiazide (ZESTORETIC) 20-12.5 MG tablet Take 1 tablet by mouth daily. 10/23/15   Hayden Rasmussen, NP  metFORMIN (GLUCOPHAGE) 1000 MG tablet Take one tablet po with evening meal 10/23/15   Hayden Rasmussen, NP  simvastatin (ZOCOR) 20 MG tablet Take 20 mg by mouth.  11/03/13   [provider]     Critical care time: 40 mins    CRITICAL CARE Performed by: Darcella Gasman Deaun Rocha   Total critical care time: 40 minutes  Critical care time was exclusive of separately billable procedures and treating other patients.  Critical care was necessary to treat or prevent imminent or life-threatening deterioration.  Critical care was time spent personally  by me on the following activities: development of treatment plan with patient and/or surrogate as well as nursing, discussions with consultants, evaluation of patient's response to treatment, examination of patient, obtaining history from patient or surrogate, ordering and performing treatments and interventions, ordering and review of laboratory studies, ordering and review of radiographic studies, pulse oximetry and re-evaluation of patient's condition.   Darcella Gasman Esbeydi Manago, PA-C [facility] See Amion for pager If no response to pager , please call [phone] until 7pm After 7:00 pm call Elink  [phone]

## 2020-08-27 NOTE — Progress Notes (Signed)
eLink Physician-Brief Progress Note Patient Name: Samantha [name] DOB: [birth year] MRN: [id number]   Date of Service  08/30/2020  HPI/Events of Note  Multiple issues: 1. Neurology recommends titration of Cardene IV infusion for SBP < 180. 2. Neurology put patient on 3%  NaCl IV infusion for cytotoxic cerebral edema expected d/t large L MCA territory infarct.  eICU Interventions  Plan: Adjusted Cardene titration parameter to SBP < 180. PCCM ground team notified of need for CVL.     Intervention Category Major Interventions: Hypertension - evaluation and management;Other:  Lenell Antu 08/18/2020, 5:13 AM

## 2020-08-27 NOTE — Plan of Care (Signed)
PCCM interval progress note:   Met with patient's niece and nephew at the bedside and reviewed patient's current status and large CVA.  Her family confirms that she would not want to live as a "vegetable" for any length of time.   They agree with continued aggressive care, but confirm that she would not want CPR if her heart were to stop.   Will consult palliative care to assist with further goals of care discussion.    Otilio Carpen Emelynn Rance, PA-C

## 2020-08-27 NOTE — Procedures (Addendum)
  Central Venous Catheter Insertion Procedure Note  Kyisha Fowle  [id number]  08/11/1960  Date:09/04/2020  Time:8:31 AM   Provider Performing:Rohen Kimes R Emaly Boschert   Procedure: Insertion of Non-tunneled Central Venous Catheter(36556) with US guidance (30160)   Indication(s) Medication administration  Consent Unable to obtain consent due to emergent nature of procedure.  Anesthesia Topical only with 1% lidocaine   Timeout Verified patient identification, verified procedure, site/side was marked, verified correct patient position, special equipment/implants available, medications/allergies/relevant history reviewed, required imaging and test results available.  Sterile Technique Maximal sterile technique including full sterile barrier drape, hand hygiene, sterile gown, sterile gloves, mask, hair covering, sterile ultrasound probe cover (if used).  Procedure Description Area of catheter insertion was cleaned with chlorhexidine and draped in sterile fashion.  With real-time ultrasound guidance a central venous catheter was placed into the right internal jugular vein. Nonpulsatile blood flow and easy flushing noted in all ports.  The catheter was sutured in place and sterile dressing applied.  Complications/Tolerance None; patient tolerated the procedure well. Chest X-ray is ordered to verify placement for internal jugular or subclavian cannulation.   Chest x-ray is not ordered for femoral cannulation.  EBL Minimal  Specimen(s) None      Darcella Gasman Kyliee Ortego, PA-C

## 2020-08-28 ENCOUNTER — Inpatient Hospital Stay (HOSPITAL_COMMUNITY): Payer: Self-pay

## 2020-08-28 ENCOUNTER — Other Ambulatory Visit (HOSPITAL_COMMUNITY): Payer: Self-pay

## 2020-08-28 DIAGNOSIS — I639 Cerebral infarction, unspecified: Secondary | ICD-10-CM

## 2020-08-28 DIAGNOSIS — R401 Stupor: Secondary | ICD-10-CM

## 2020-08-28 DIAGNOSIS — N289 Disorder of kidney and ureter, unspecified: Secondary | ICD-10-CM

## 2020-08-28 DIAGNOSIS — I7389 Other specified peripheral vascular diseases: Secondary | ICD-10-CM

## 2020-08-28 LAB — BASIC METABOLIC PANEL
Anion gap: 15 (ref 5–15)
Anion gap: 7 (ref 5–15)
BUN: 57 mg/dL — ABNORMAL HIGH (ref 6–20)
BUN: 49 mg/dL — ABNORMAL HIGH (ref 6–20)
CO2: 18 mmol/L — ABNORMAL LOW (ref 22–32)
CO2: 24 mmol/L (ref 22–32)
Calcium: 8.8 mg/dL — ABNORMAL LOW (ref 8.9–10.3)
Calcium: 8.7 mg/dL — ABNORMAL LOW (ref 8.9–10.3)
Chloride: 114 mmol/L — ABNORMAL HIGH (ref 98–111)
Chloride: 128 mmol/L — ABNORMAL HIGH (ref 98–111)
Creatinine, Ser: 1.86 mg/dL — ABNORMAL HIGH (ref 0.44–1.00)
Creatinine, Ser: 1.68 mg/dL — ABNORMAL HIGH (ref 0.44–1.00)
GFR, Estimated: 31 mL/min — ABNORMAL LOW (ref >60)
GFR, Estimated: 35 mL/min — ABNORMAL LOW (ref >60)
Glucose, Bld: 677 mg/dL (ref 70–99)
Glucose, Bld: 157 mg/dL — ABNORMAL HIGH (ref 70–99)
Potassium: 4.9 mmol/L (ref 3.5–5.1)
Potassium: 3.6 mmol/L (ref 3.5–5.1)
Sodium: 147 mmol/L — ABNORMAL HIGH (ref 135–145)
Sodium: 159 mmol/L — ABNORMAL HIGH (ref 135–145)

## 2020-08-28 LAB — GLUCOSE, CAPILLARY
Glucose-Capillary: 132 mg/dL — ABNORMAL HIGH (ref 70–99)
Glucose-Capillary: 133 mg/dL — ABNORMAL HIGH (ref 70–99)
Glucose-Capillary: 141 mg/dL — ABNORMAL HIGH (ref 70–99)
Glucose-Capillary: 161 mg/dL — ABNORMAL HIGH (ref 70–99)
Glucose-Capillary: 180 mg/dL — ABNORMAL HIGH (ref 70–99)
Glucose-Capillary: 205 mg/dL — ABNORMAL HIGH (ref 70–99)
Glucose-Capillary: 213 mg/dL — ABNORMAL HIGH (ref 70–99)

## 2020-08-28 LAB — CBC
HCT: 41.6 % (ref 36.0–46.0)
Hemoglobin: 13 g/dL (ref 12.0–15.0)
MCH: 27.7 pg (ref 26.0–34.0)
MCHC: 31.3 g/dL (ref 30.0–36.0)
MCV: 88.5 fL (ref 80.0–100.0)
Platelets: 276 10*3/uL (ref 150–400)
RBC: 4.7 MIL/uL (ref 3.87–5.11)
RDW: 14.6 % (ref 11.5–15.5)
WBC: 15.4 10*3/uL — ABNORMAL HIGH (ref 4.0–10.5)
nRBC: 0 % (ref 0.0–0.2)

## 2020-08-28 LAB — CK: Total CK: 1117 U/L — ABNORMAL HIGH (ref 38–234)

## 2020-08-28 LAB — LACTIC ACID, PLASMA
Lactic Acid, Venous: 2.2 mmol/L (ref 0.5–1.9)
Lactic Acid, Venous: 2.6 mmol/L (ref 0.5–1.9)
Lactic Acid, Venous: <0.3 mmol/L — ABNORMAL LOW (ref 0.5–1.9)

## 2020-08-28 LAB — PROTIME-INR
INR: 1.4 — ABNORMAL HIGH (ref 0.8–1.2)
Prothrombin Time: 17.5 seconds — ABNORMAL HIGH (ref 11.4–15.2)

## 2020-08-28 LAB — MAGNESIUM: Magnesium: 2.7 mg/dL — ABNORMAL HIGH (ref 1.7–2.4)

## 2020-08-28 LAB — PHOSPHORUS
Phosphorus: <1 mg/dL — CL (ref 2.5–4.6)
Phosphorus: 4.5 mg/dL (ref 2.5–4.6)

## 2020-08-28 LAB — POTASSIUM: Potassium: 3.3 mmol/L — ABNORMAL LOW (ref 3.5–5.1)

## 2020-08-28 MED ORDER — HEPARIN SODIUM (PORCINE) 5000 UNIT/ML IJ SOLN
5000.0000 [IU] | Freq: Three times a day (TID) | INTRAMUSCULAR | Status: DC
  Administered 2020-08-28 – 2020-08-30 (×6): 5000 [IU] via SUBCUTANEOUS
  Filled 2020-08-28 (×6): qty 1

## 2020-08-28 MED ORDER — LABETALOL HCL 5 MG/ML IV SOLN
20.0000 mg | INTRAVENOUS | Status: DC | PRN
  Administered 2020-08-28 – 2020-09-01 (×16): 20 mg via INTRAVENOUS
  Filled 2020-08-28 (×16): qty 4

## 2020-08-28 MED ORDER — SODIUM CHLORIDE 0.9 % IV SOLN
INTRAVENOUS | Status: DC | PRN
  Administered 2020-08-28 – 2020-08-30 (×2): 500 mL via INTRAVENOUS

## 2020-08-28 MED ORDER — VITAL HIGH PROTEIN PO LIQD
1000.0000 mL | ORAL | Status: DC
  Administered 2020-08-28 – 2020-08-31 (×4): 1000 mL

## 2020-08-28 MED ORDER — INSULIN ASPART 100 UNIT/ML IJ SOLN
2.0000 [IU] | INTRAMUSCULAR | Status: DC
  Administered 2020-08-28: 4 [IU] via SUBCUTANEOUS
  Administered 2020-08-28: 2 [IU] via SUBCUTANEOUS
  Administered 2020-08-29 (×5): 4 [IU] via SUBCUTANEOUS
  Administered 2020-08-29: 2 [IU] via SUBCUTANEOUS
  Administered 2020-08-29 – 2020-08-30 (×2): 4 [IU] via SUBCUTANEOUS
  Administered 2020-08-30: 2 [IU] via SUBCUTANEOUS
  Administered 2020-08-30 (×4): 4 [IU] via SUBCUTANEOUS
  Administered 2020-08-31: 2 [IU] via SUBCUTANEOUS
  Administered 2020-08-31: 4 [IU] via SUBCUTANEOUS
  Administered 2020-08-31: 2 [IU] via SUBCUTANEOUS
  Administered 2020-08-31: 4 [IU] via SUBCUTANEOUS
  Administered 2020-08-31: 2 [IU] via SUBCUTANEOUS
  Administered 2020-09-01: 4 [IU] via SUBCUTANEOUS
  Administered 2020-09-01: 6 [IU] via SUBCUTANEOUS
  Administered 2020-09-01: 2 [IU] via SUBCUTANEOUS
  Administered 2020-09-01: 4 [IU] via SUBCUTANEOUS

## 2020-08-28 MED ORDER — POTASSIUM CHLORIDE 10 MEQ/100ML IV SOLN
10.0000 meq | INTRAVENOUS | Status: AC
  Administered 2020-08-28 (×3): 10 meq via INTRAVENOUS
  Filled 2020-08-28 (×3): qty 100

## 2020-08-28 MED ORDER — SODIUM CHLORIDE 0.9 % IV SOLN
2.0000 g | Freq: Two times a day (BID) | INTRAVENOUS | Status: DC
  Administered 2020-08-28 – 2020-08-29 (×3): 2 g via INTRAVENOUS
  Filled 2020-08-28 (×3): qty 2

## 2020-08-28 MED ORDER — INSULIN DETEMIR 100 UNIT/ML ~~LOC~~ SOLN
15.0000 [IU] | Freq: Two times a day (BID) | SUBCUTANEOUS | Status: DC
  Administered 2020-08-28 – 2020-09-01 (×9): 15 [IU] via SUBCUTANEOUS
  Filled 2020-08-28 (×11): qty 0.15

## 2020-08-28 MED ORDER — POTASSIUM PHOSPHATES 15 MMOLE/5ML IV SOLN
45.0000 mmol | Freq: Once | INTRAVENOUS | Status: AC
  Administered 2020-08-28: 45 mmol via INTRAVENOUS
  Filled 2020-08-28: qty 15

## 2020-08-28 NOTE — Progress Notes (Signed)
STROKE TEAM PROGRESS NOTE   Interval History   No acute events overnight, neuro unchanged, RN and EEG tech are at the bedside. EEG overnight no seizure. She is off propofol this am since 10am, will try to keep it off as much as we can and if no seizure by tomorrow, will d/c LTM and proceed with MRI.   Pertinent Lab Work and Imaging    08/23/2020 CT Head WO IV Contrast Acute left MCA territory infarct without hemorrhagic conversion or progressive swelling. Midline shift measures 4 mm.  Physical Examination   Temp:  [97.7 F (36.5 C)-101.5 F (38.6 C)] 100.2 F (37.9 C) (08/22 0700) Pulse Rate:  [89-125] 100 (08/22 0833) Resp:  [0-30] 19 (08/22 0833) BP: (105-184)/(49-96) 128/66 (08/22 0833) SpO2:  [93 %-100 %] 99 % (08/22 0833) FiO2 (%):  [40 %] 40 % (08/22 0833) Weight:  [86.2 kg] 86.2 kg (08/22 0500)  General - Well nourished, well developed, intubated off propofol this am.  Ophthalmologic - fundi not visualized due to noncooperation.  Cardiovascular - Regular rate and rhythm.  Neuro - intubated just off sedation, eyes closed, not following commands. With forced eye opening, eyes in mid position with slight left gaze, not blinking to visual threat, doll's eyes sluggish but present, not tracking, pupil equal size 2.5 mm, sluggish to light. Corneal reflex weakly present bilaterally, gag and cough present. Breathing over the vent.  Facial symmetry not able to test due to ET tube.  Tongue protrusion not cooperative. On pain stimulation, withdraw left upper and lower extremity, but no movement of right upper and lower extremity. DTR 1+ and no babinski. Sensation, coordination and gait not tested.  Assessment and Plan   Ms. Sarissa Dern is a 60 y.o. 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   Stroke - L MCA large infarct, embolic  pattern, source unclear, concerning for multiple uncontrolled stroke risk factors.  However, cardioembolic source cannot be ruled out at this time.  CT head left large MCA infarct with MLS 22mm Repeat CT head stable infarct and MLS 80mm MRI Head pending MRA pending CUS left ICA unremarkable BLE doppler no DVT Echo EF 65-70% LDL 158 hemoglobin a1c 12.1  Not on antithrombotics PTA, now on ASA 325.  PT OT recommendation pending Dispo pending  Cerebral edema CT x 2 stable MLS 47mm Was on 3% saline, now off given Na at goal Continue NS @ 75 Na monitoring MRI pending   Seizure  Presented with right face and arm twitching Loaded with keppra LTM EEG - Continuous slow, generalized and lateralized left hemisphere Continue keppra 500mg  bid Plan to d/c LTM EEG if no Sz overnight  Respiratory failure Intubated on vent CCM on board Off propofol since am Extubate as able  Pseudohyponatremia, resolved Due to sever hyperglycemia Na level arise after correction of hypoglycemia Off 3% saline -> NS @ 75 Na [id number]  Uncontrolled DM HHS CCM on board A1C 12.1 Insulin drip -> levemir  SSI CBG monitoring On IVF  Hypertension Home meds - lisinopril-hctz, clonidine patch at home.  Stable now BP goal < 180/105 LTM BP goal normotensive  Hyperlipidemia Not on statin PTA LDL goal is < 70. LDL is 158 on Atorvastatin 80 mg Continue statin on discharge  Rhabdomyolysis Dehydration AKI Cre 2.15->1.86->1.33->1.68 On IVF CCM on  board BMP monitoring  Hospital day # 1  This patient is critically ill due to left large MCA infarct, seizure, HHS, DKA, AKI, rhabdo and at significant risk of neurological worsening, death form recurrent stroke, hemorrhagic conversion, status epilepticus, HHS, coma, renal failure. This patient's care requires constant monitoring of vital signs, hemodynamics, respiratory and cardiac monitoring, review of multiple databases, neurological assessment,  discussion with family, other specialists and medical decision making of high complexity. I spent 40 minutes of neurocritical care time in the care of this patient. I discussed with Dr. Wynona Neat CCM and Dr. Melynda Ripple epileptologist.   Marvel Plan, MD PhD Stroke Neurology 08/28/2020 12:02 PM    To contact Stroke Continuity provider, please refer to [email or web address]. After hours, contact General Neurology

## 2020-08-28 NOTE — Progress Notes (Signed)
Maintenance performed. No skin breakdown noted. Dr. Roda Shutters was bedside.

## 2020-08-28 NOTE — Progress Notes (Signed)
Initial Nutrition Assessment  DOCUMENTATION CODES:   Obesity unspecified  INTERVENTION:   Initiate trickle feeds:  Vital High Protein @ 20 ml/hr via OGT (480 ml daily)   Pending goals of care, recommend increase to goal rate of 45 ml/hr.   Tube feeding regimen provides 1080 kcal (100% of needs), 95 grams of protein, and 903 ml of H2O.    NUTRITION DIAGNOSIS:   Inadequate oral intake related to inability to eat as evidenced by NPO status.  GOAL:   Patient will meet greater than or equal to 90% of their needs  MONITOR:   Vent status, Labs, Weight trends, Skin, I & O's  REASON FOR ASSESSMENT:   Ventilator    ASSESSMENT:   Samantha [name] is a 60 y.o. female with medical history significant of osteoarthritis, recent COVID infection, recent diagnosis of UTI, confusion, nosebleed right nare presented for generalized weaknesses, confusion, bright red clots per rectum/stool.  Pt admitted with acute respiratory failure and large lt MCA.   Patient is currently intubated on ventilator support. OGT connected to low, intermittent suction.  MV: 6.9 L/min Temp (24hrs), Avg:100 F (37.8 C), Min:98.2 F (36.8 C), Max:101.5 F (38.6 C)  Reviewed I/O's: +2.3 L x 24 hours and +4 L since admission  UOP: 1.2 L x 24 hours  Case discussed with RN and MD; pt with poor prognosis, but plan for MRI tomorrow to guide further goals of care discussions. MD amenable to trickle feedings at this time.   Reviewed wt hx; wt has been stable over the past several years.   Medications reviewed and include 0.9% sodium chloride infusion @ 75 ml/hr and keppra.   Labs reviewed: CBGS: 180-213 (inpatient orders for glycemic control are 2-6 units insulin aspart every 4 hours and 15 units insulin detemir BID).     NUTRITION - FOCUSED PHYSICAL EXAM:  Flowsheet Row Most Recent Value  Orbital Region No depletion  Upper Arm Region No depletion  Thoracic and Lumbar Region No depletion  Buccal Region  No depletion  Temple Region No depletion  Clavicle Bone Region No depletion  Clavicle and Acromion Bone Region No depletion  Scapular Bone Region No depletion  Dorsal Hand No depletion  Patellar Region No depletion  Anterior Thigh Region No depletion  Posterior Calf Region No depletion  Edema (RD Assessment) None  Hair Reviewed  Eyes Reviewed  Mouth Reviewed  Skin Reviewed  Nails Reviewed       Diet Order:   Diet Order             Diet NPO time specified  Diet effective now                   EDUCATION NEEDS:   Not appropriate for education at this time  Skin:  Skin Assessment: Reviewed RN Assessment  Last BM:  08/18/2020  Height:   Ht Readings from Last 1 Encounters:  08/11/2020 5\' 1"  (1.549 m)    Weight:   Wt Readings from Last 1 Encounters:  08/28/20 86.2 kg    Ideal Body Weight:  47.7 kg  BMI:  Body mass index is 35.91 kg/m.  Estimated Nutritional Needs:   Kcal:  08/30/20  Protein:  95-120 grams  Fluid:  > 1.2 L    [phone], RD, LDN, CDCES Registered Dietitian II Certified Diabetes Care and Education Specialist Please refer to [facility] for RD and/or RD on-call/weekend/after hours pager

## 2020-08-28 NOTE — Progress Notes (Signed)
Carotid artery duplex and bilateral lower extremity venous duplex has been completed. Preliminary results can be found in CV Proc through chart review.   08/28/20 10:55 AM Olen Cordial RVT

## 2020-08-28 NOTE — Progress Notes (Signed)
NAME:  Samantha [name], MRN:  [id number], DOB:  June 28, 1960, LOS: 1 ADMISSION DATE:  September 12, 2020, CONSULTATION DATE:  September 12, 2020 REFERRING MD: Dr. Daun Peacock, CHIEF COMPLAINT:  ALOC   History of Present Illness:  60 year old black female that presented to the emergency room from home.  The patient was found down in her bathroom.  She is unconscious unresponsive.  Family had not heard from her for 2 days therefore they came to the house and saw her on the floor through the bathroom window.  911/EMS was called.  Patient was found to have significant respiratory distress not able to protect her airway therefore was intubated on arrival to the emergency room.  It was noted that her right arm and leg were completely flaccid.  Prior to intubation.  Patient has a long history of hypertension.    Workup in the ED was significant for large L MCA territory infarct, likely subacute, without hemorrhage.  CT C-spine without acute findings.  Labs showed an initial glucose of >600, creatinine 2.15 (baseline <1), WBC 19k, CK 1130  Pertinent  Medical History  Hypertension Cerebral aneurysm Diabetes mellitus type 2 Hyperlipidemia  Significant Hospital Events: Including procedures, antibiotic start and stop dates in addition to other pertinent events   8/21 Brought in from home, intubated, admit to ICU, R IJ CVC placed 8/21 Initial head CT with large L MCA infarct, likely subacute  8/22 for MRI today  Objective   Blood pressure 128/66, pulse 100, temperature 100.2 F (37.9 C), resp. rate 19, height 5\' 1"  (1.549 m), weight 86.2 kg, SpO2 99 %.    Vent Mode: PRVC FiO2 (%):  [40 %] 40 % Set Rate:  [18 bmp] 18 bmp Vt Set:  [400 mL] 400 mL PEEP:  [5 cmH20] 5 cmH20 Plateau Pressure:  [15 cmH20-20 cmH20] 17 cmH20   Intake/Output Summary (Last 24 hours) at 08/28/2020 1018 Last data filed at 08/28/2020 08/30/2020 Gross per 24 hour  Intake 2970.63 ml  Output 625 ml  Net 2345.63 ml   Filed Weights   12-Sep-2020 0334  08/28/20 0500  Weight: 86.2 kg 86.2 kg   General: Sedated, unresponsive HEENT: Moist oral mucosa, endotracheal tube in place Neuro: examined on propofol, intermittent movement of the LUE and LLE, Right side is flacid,  CV: S1-S2 appreciated PULM: Clear breath sounds bilaterally GI: soft, bsx4 active  Extremities: Warm and dry, no edema Skin: No rash  Assessment & Plan:   Acute respiratory failure in the setting of large MCA CVA Unknown downtime possibly down for up to 2 days -Continue full vent support -Bronchial hygiene -VAP prevention  Possible seizure -On propofol -Continuous EEG  HHS -Continue insulin  Severe hypertension -Goal SBP less than 180  Possible rhabdomyolysis Acute renal insufficiency -Trend electrolytes -Avoid nephrotoxic's  To have MRI today  Best Practice (right click and "Reselect all SmartList Selections" daily)   Diet/type: NPO DVT prophylaxis: LMWH GI prophylaxis: PPI Lines: Central line Foley:  Yes, and it is still needed Code Status:  full code   Labs   CBC: Recent Labs  Lab 2020/09/12 0116 12-Sep-2020 0150 09-12-20 0218 September 12, 2020 0909 08/28/20 0345  WBC 19.3*  --   --  20.9* 15.4*  NEUTROABS 17.4*  --   --   --   --   HGB 15.7* 17.3* 15.6* 13.8 13.0  HCT 51.2* 51.0* 46.0 43.2 41.6  MCV 89.7  --   --  86.4 88.5  PLT 394  --   --  337 276  Basic Metabolic Panel: Recent Labs  Lab 09/04/2020 0441 08/22/2020 0555 09/05/2020 0909 08/20/2020 1340 08/28/20 0007 08/28/20 0345  NA 147*  148* 151* 157* 160*  --  159*  K 4.9 3.7 2.9* 3.2* 3.3* 3.6  CL 114* 117* 126* 129*  --  128*  CO2 18* 19* 25 27  --  24  GLUCOSE 677* 608* 317* 216*  --  157*  BUN 57* 58* 57* 49*  --  49*  CREATININE 1.86* 1.84* 1.82* 1.33*  --  1.68*  CALCIUM 8.8* 9.3 9.2 9.3  --  8.7*  MG  --  3.0*  --   --   --  2.7*  PHOS  --  3.2  --   --   --  <1.0*   GFR: Estimated Creatinine Clearance: 36.9 mL/min (A) (by C-G formula based on SCr of 1.68 mg/dL  (H)). Recent Labs  Lab 09/04/2020 0116 08/26/2020 0909 08/14/2020 0934 08/28/20 0007 08/28/20 0245 08/28/20 0345  WBC 19.3* 20.9*  --   --   --  15.4*  LATICACIDVEN  --   --  3.5* 2.2* 2.6*  --     Liver Function Tests: Recent Labs  Lab 09/01/2020 0116  AST 29  ALT 15  ALKPHOS 94  BILITOT 0.8  PROT 8.5*  ALBUMIN 3.8   No results for input(s): LIPASE, AMYLASE in the last 168 hours. Recent Labs  Lab 08/15/2020 0411  AMMONIA 28    ABG    Component Value Date/Time   PHART 7.366 08/24/2020 0218   PCO2ART 37.9 08/20/2020 0218   PO2ART 273 (H) 09/01/2020 0218   HCO3 21.8 08/23/2020 0218   TCO2 23 08/23/2020 0218   ACIDBASEDEF 3.0 (H) 08/26/2020 0218   O2SAT 100.0 08/16/2020 0218     Coagulation Profile: Recent Labs  Lab 09/05/2020 0441  INR 2.6*    Cardiac Enzymes: Recent Labs  Lab 08/15/2020 0116 09/01/2020 0909 08/28/20 0345  CKTOTAL 1,130* 1,983* 1,117*    HbA1C: Hgb A1c MFr Bld  Date/Time Value Ref Range Status  08/21/2020 09:09 AM 12.1 (H) 4.8 - 5.6 % Final    Comment:    (NOTE) Pre diabetes:          5.7%-6.4%  Diabetes:              >6.4%  Glycemic control for   <7.0% adults with diabetes     CBG: Recent Labs  Lab 08/24/2020 1635 08/29/2020 1945 08/28/20 0019 08/28/20 0354 08/28/20 0753  GLUCAP 165* 206* 205* 141* 161*    Review of Systems:   Unable to obtain due to neuro status  Past Medical History:  She,  has a past medical history of Brain aneurysm, DM w/o complication type II (HCC), Hypercholesteremia, and Hypertension.   Surgical History:   Past Surgical History:  Procedure Laterality Date   BRAIN SURGERY     States they went through her stomach to repair  bleed in her head in '05   CEREBRAL ANEURYSM REPAIR     in 2005     Social History:   reports that she has never smoked. She has never used smokeless tobacco. She reports that she does not drink alcohol and does not use drugs.   Family History:  Her family history includes  Aneurysm in her sister; CVA in her mother; Cancer in her brother and brother; Diabetes in her father; Heart attack in her father; Heart disease in her father; Heart failure in her mother; Hypertension in her mother. There  is no history of Other.   Allergies No Known Allergies   Home Medications  Prior to Admission medications   Medication Sig Start Date End Date Taking? Authorizing Provider  aspirin EC 81 MG tablet Take 81 mg by mouth See admin instructions. Take 1 tablet (81 mg) daily, take an additional 1 tablet daily if needed for pain 10/24/11   Brent Bulla, MD  cloNIDine (CATAPRES - DOSED IN MG/24 HR) 0.2 mg/24hr patch APPLY 1 PATCH TOPICALLY ONCE A WEEK 09/19/17   [provider]  ibuprofen (ADVIL,MOTRIN) 400 MG tablet Take 1 tablet (400 mg total) by mouth every 6 (six) hours as needed. 07/29/14   Linwood Dibbles, MD  lisinopril-hydrochlorothiazide (ZESTORETIC) 20-12.5 MG tablet Take 1 tablet by mouth daily. 10/23/15   Hayden Rasmussen, NP  metFORMIN (GLUCOPHAGE) 1000 MG tablet Take one tablet po with evening meal 10/23/15   Hayden Rasmussen, NP  simvastatin (ZOCOR) 20 MG tablet Take 20 mg by mouth.  11/03/13   [provider]   The patient is critically ill with multiple organ systems failure and requires high complexity decision making for assessment and support, frequent evaluation and titration of therapies, application of advanced monitoring technologies and extensive interpretation of multiple databases. Critical Care Time devoted to patient care services described in this note independent of APP/resident time (if applicable)  is 32 minutes.   Virl Diamond MD [facility] Personal pager: See Amion If unanswered, please page CCM On-call: #[phone]

## 2020-08-28 NOTE — Progress Notes (Signed)
RT NOTE: holding SBT on patient this AM due to being on continuous EEG and possibility of MRI trip.  Tolerating current settings well.  Will continue to monitor.

## 2020-08-28 NOTE — Progress Notes (Signed)
PHARMACY NOTE:  ANTIMICROBIAL RENAL DOSAGE ADJUSTMENT  Current antimicrobial regimen includes a mismatch between antimicrobial dosage and estimated renal function.  As per policy approved by the Pharmacy & Therapeutics and Medical Executive Committees, the antimicrobial dosage will be adjusted accordingly.  Current antimicrobial dosage: Cefepime 2g IV Q24H   Indication: Sepsis 2/2 Klebsiella pneumoniae   Renal Function:  Estimated Creatinine Clearance: 36.9 mL/min (A) (by C-G formula based on SCr of 1.68 mg/dL (H)).     Antimicrobial dosage has been changed to: Cefepime 2g IV Q12H    Thank you for allowing pharmacy to be a part of this patient's care.  Jani Gravel, PharmD PGY-1 Acute Care Resident  08/28/2020 9:24 AM

## 2020-08-28 NOTE — Procedures (Signed)
Patient Name: Samantha [name]  MRN: [id number]  Epilepsy Attending: Charlsie Quest  Referring Physician/Provider: Dr Erick Blinks Duration: 2020/09/07 2355 to 08/28/2020 7322  Patient history: 60 y.o. 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 Found to have a large L MCA stroke with cerebral edema. She is outside the window for any intervention and her stroke appears completed. She was also noted to have twitching movements concerning for a seizure.  EEG to evaluate for seizures.  Level of alertness:  comatose  AEDs during EEG study: Propofol, LEV  Technical aspects: This EEG study was done with scalp electrodes positioned according to the 10-20 International system of electrode placement. Electrical activity was acquired at a sampling rate of 500Hz  and reviewed with a high frequency filter of 70Hz  and a low frequency filter of 1Hz . EEG data were recorded continuously and digitally stored.   Description: No clear posterior dominant rhythm was seen.  Sleep was characterized by sleep spindles (12 to 14 Hz), maximal frontocentral region. EEG showed continuous generalized and lateralized left hemisphere sharply contoured 5 to 6 Hz theta slowing as well as intermittent generalized 2 to 3 Hz delta slowing.  Hyperventilation and photic stimulation were not performed.     ABNORMALITY - Continuous slow, generalized and lateralized left hemisphere  IMPRESSION: This study is suggestive of cortical dysfunction in left hemisphere likely secondary to underlying structural abnormality/stroke.  There is also moderate to severe diffuse encephalopathy, nonspecific etiology but could be secondary to sedation. No seizures or epileptiform discharges were seen throughout the recording.  Jodelle Fausto 

## 2020-08-29 DIAGNOSIS — R4182 Altered mental status, unspecified: Secondary | ICD-10-CM

## 2020-08-29 DIAGNOSIS — Z66 Do not resuscitate: Secondary | ICD-10-CM

## 2020-08-29 DIAGNOSIS — Z7189 Other specified counseling: Secondary | ICD-10-CM

## 2020-08-29 DIAGNOSIS — Z515 Encounter for palliative care: Secondary | ICD-10-CM

## 2020-08-29 DIAGNOSIS — I471 Supraventricular tachycardia: Secondary | ICD-10-CM

## 2020-08-29 DIAGNOSIS — R509 Fever, unspecified: Secondary | ICD-10-CM

## 2020-08-29 DIAGNOSIS — T796XXD Traumatic ischemia of muscle, subsequent encounter: Secondary | ICD-10-CM

## 2020-08-29 DIAGNOSIS — R7881 Bacteremia: Secondary | ICD-10-CM

## 2020-08-29 LAB — CULTURE, BLOOD (ROUTINE X 2): Special Requests: ADEQUATE

## 2020-08-29 LAB — BASIC METABOLIC PANEL
Anion gap: 11 (ref 5–15)
BUN: 41 mg/dL — ABNORMAL HIGH (ref 6–20)
BUN: 37 mg/dL — ABNORMAL HIGH (ref 6–20)
CO2: 21 mmol/L — ABNORMAL LOW (ref 22–32)
CO2: 22 mmol/L (ref 22–32)
Calcium: 8.4 mg/dL — ABNORMAL LOW (ref 8.9–10.3)
Calcium: 8 mg/dL — ABNORMAL LOW (ref 8.9–10.3)
Chloride: 129 mmol/L — ABNORMAL HIGH (ref 98–111)
Chloride: >130 mmol/L (ref 98–111)
Creatinine, Ser: 1.36 mg/dL — ABNORMAL HIGH (ref 0.44–1.00)
Creatinine, Ser: 1.35 mg/dL — ABNORMAL HIGH (ref 0.44–1.00)
GFR, Estimated: 45 mL/min — ABNORMAL LOW (ref >60)
GFR, Estimated: 46 mL/min — ABNORMAL LOW (ref >60)
Glucose, Bld: 187 mg/dL — ABNORMAL HIGH (ref 70–99)
Glucose, Bld: 191 mg/dL — ABNORMAL HIGH (ref 70–99)
Potassium: 3.9 mmol/L (ref 3.5–5.1)
Potassium: 3.7 mmol/L (ref 3.5–5.1)
Sodium: 161 mmol/L (ref 135–145)
Sodium: 160 mmol/L — ABNORMAL HIGH (ref 135–145)

## 2020-08-29 LAB — GLUCOSE, CAPILLARY
Glucose-Capillary: 162 mg/dL — ABNORMAL HIGH (ref 70–99)
Glucose-Capillary: 164 mg/dL — ABNORMAL HIGH (ref 70–99)
Glucose-Capillary: 167 mg/dL — ABNORMAL HIGH (ref 70–99)
Glucose-Capillary: 169 mg/dL — ABNORMAL HIGH (ref 70–99)
Glucose-Capillary: 174 mg/dL — ABNORMAL HIGH (ref 70–99)
Glucose-Capillary: 182 mg/dL — ABNORMAL HIGH (ref 70–99)

## 2020-08-29 MED ORDER — HYDRALAZINE HCL 20 MG/ML IJ SOLN
20.0000 mg | INTRAMUSCULAR | Status: AC | PRN
  Administered 2020-08-29 – 2020-08-30 (×4): 20 mg via INTRAVENOUS
  Filled 2020-08-29 (×4): qty 1

## 2020-08-29 MED ORDER — HYDRALAZINE HCL 20 MG/ML IJ SOLN
5.0000 mg | INTRAMUSCULAR | Status: AC | PRN
Start: 2020-08-29 — End: 2020-08-29
  Administered 2020-08-29 (×2): 5 mg via INTRAVENOUS
  Filled 2020-08-29 (×2): qty 1

## 2020-08-29 MED ORDER — CEFAZOLIN SODIUM-DEXTROSE 2-4 GM/100ML-% IV SOLN: 2.0000 g | Freq: Three times a day (TID) | INTRAVENOUS | Status: DC

## 2020-08-29 MED ORDER — CEFAZOLIN SODIUM-DEXTROSE 2-4 GM/100ML-% IV SOLN
2.0000 g | Freq: Three times a day (TID) | INTRAVENOUS | Status: DC
Start: 2020-08-29 — End: 2020-09-02
  Administered 2020-08-29 – 2020-09-02 (×11): 2 g via INTRAVENOUS
  Filled 2020-08-29 (×12): qty 100

## 2020-08-29 MED ORDER — AMLODIPINE BESYLATE 5 MG PO TABS
5.0000 mg | ORAL_TABLET | Freq: Every day | ORAL | Status: DC
  Administered 2020-08-29 – 2020-09-01 (×4): 5 mg
  Filled 2020-08-29 (×4): qty 1

## 2020-08-29 MED ORDER — AMLODIPINE BESYLATE 5 MG PO TABS: 5.0000 mg | ORAL_TABLET | Freq: Every day | ORAL | Status: DC

## 2020-08-29 NOTE — Progress Notes (Signed)
LTM maintenance completed; no skin breakdown was seen; reprepped under A1, P7, and F7, tested event button.

## 2020-08-29 NOTE — Progress Notes (Signed)
LTM maint complete No skin breakowndown Fp1 Fp2 F3 F7 [facility] monitored, Event button test confirmed by [facility].

## 2020-08-29 NOTE — Progress Notes (Signed)
NAME:  Samantha [name], MRN:  [id number], DOB:  1960-09-12, LOS: 2 ADMISSION DATE:  08/14/2020, CONSULTATION DATE:  08/07/2020 REFERRING MD: Dr. Daun Peacock, CHIEF COMPLAINT:  ALOC   History of Present Illness:  60 year old black female that presented to the emergency room from home.  The patient was found down in her bathroom.  She is unconscious unresponsive.  Family had not heard from her for 2 days therefore they came to the house and saw her on the floor through the bathroom window.  911/EMS was called.  Patient was found to have significant respiratory distress not able to protect her airway therefore was intubated on arrival to the emergency room.  It was noted that her right arm and leg were completely flaccid.  Prior to intubation.  Patient has a long history of hypertension.    Workup in the ED was significant for large L MCA territory infarct, likely subacute, without hemorrhage.  CT C-spine without acute findings.  Labs showed an initial glucose of >600, creatinine 2.15 (baseline <1), WBC 19k, CK 1130  Pertinent  Medical History  Hypertension Cerebral aneurysm Diabetes mellitus type 2 Hyperlipidemia  Significant Hospital Events: Including procedures, antibiotic start and stop dates in addition to other pertinent events   8/21 Brought in from home, intubated, admit to ICU, R IJ CVC placed 8/21 Initial head CT with large L MCA infarct, likely subacute  8/23 for MRI today  Objective   Blood pressure (!) 191/74, pulse 74, temperature 99.7 F (37.6 C), resp. rate (!) 23, height 5\' 1"  (1.549 m), weight 87 kg, SpO2 100 %.    Vent Mode: PRVC FiO2 (%):  [40 %] 40 % Set Rate:  [18 bmp] 18 bmp Vt Set:  [400 mL] 400 mL PEEP:  [5 cmH20] 5 cmH20 Plateau Pressure:  [15 cmH20-17 cmH20] 15 cmH20   Intake/Output Summary (Last 24 hours) at 08/29/2020 1332 Last data filed at 08/29/2020 1320 Gross per 24 hour  Intake 2732.04 ml  Output 1250 ml  Net 1482.04 ml   Filed Weights   08/24/2020  0334 08/28/20 0500 08/29/20 0500  Weight: 86.2 kg 86.2 kg 87 kg   General: Unresponsive HEENT: Moist oral mucosa, endotracheal tube in place Neuro: Intermittent movement on the left, right side remains flaccid CV: S1-S2 appreciated PULM: Clear breath sounds bilaterally GI: soft, bsx4 active  Extremities: Skin is warm and dry Skin: No rash  EEG results noted-no seizures noted Assessment & Plan:   Acute respiratory failure in the setting of large MCA CVA Unknown downtime, possibly down for up to 2 days -Continue full ventilator support -VAP prevention -Bronchial hygiene  Hypernatremia -Continue to trend -Did receive 3% saline, will monitor  Klebsiella bacteremia Fever -Switched to cefazolin -Follow fever curve  Paroxysmal SVT -Follow  Hypertension Norvasc added  Possible seizures -EEG negative for seizures -Remains encephalopathic  Rhabdomyolysis Acute kidney injury  For MRI today he  Best Practice (right click and "Reselect all SmartList Selections" daily)   Diet/type: tubefeeds DVT prophylaxis: LMWH GI prophylaxis: PPI Lines: Central line Foley:  Yes, and it is still needed Code Status:  full code   Labs   CBC: Recent Labs  Lab 08/20/2020 0116 08/15/2020 0150 09/06/2020 0218 08/26/2020 0909 08/28/20 0345  WBC 19.3*  --   --  20.9* 15.4*  NEUTROABS 17.4*  --   --   --   --   HGB 15.7* 17.3* 15.6* 13.8 13.0  HCT 51.2* 51.0* 46.0 43.2 41.6  MCV 89.7  --   --  86.4 88.5  PLT 394  --   --  337 276    Basic Metabolic Panel: Recent Labs  Lab 09/25/20 0555 2020-09-25 0909 2020-09-25 1340 08/28/20 0007 08/28/20 0345 08/28/20 1140 08/29/20 0438 08/29/20 0856  NA 151* 157* 160*  --  159*  --  161* 160*  K 3.7 2.9* 3.2* 3.3* 3.6  --  3.9 3.7  CL 117* 126* 129*  --  128*  --  129* >130*  CO2 19* 25 27  --  24  --  21* 22  GLUCOSE 608* 317* 216*  --  157*  --  187* 191*  BUN 58* 57* 49*  --  49*  --  41* 37*  CREATININE 1.84* 1.82* 1.33*  --  1.68*  --   1.36* 1.35*  CALCIUM 9.3 9.2 9.3  --  8.7*  --  8.4* 8.0*  MG 3.0*  --   --   --  2.7*  --   --   --   PHOS 3.2  --   --   --  <1.0* 4.5  --   --    GFR: Estimated Creatinine Clearance: 46.1 mL/min (A) (by C-G formula based on SCr of 1.35 mg/dL (H)). Recent Labs  Lab 2020/09/25 0116 09/25/2020 0909 2020-09-25 0934 08/28/20 0007 08/28/20 0245 08/28/20 0345 08/28/20 1702  WBC 19.3* 20.9*  --   --   --  15.4*  --   LATICACIDVEN  --   --  3.5* 2.2* 2.6*  --  <0.3*    Liver Function Tests: Recent Labs  Lab 25-Sep-2020 0116  AST 29  ALT 15  ALKPHOS 94  BILITOT 0.8  PROT 8.5*  ALBUMIN 3.8   No results for input(s): LIPASE, AMYLASE in the last 168 hours. Recent Labs  Lab Sep 25, 2020 0411  AMMONIA 28    ABG    Component Value Date/Time   PHART 7.366 25-Sep-2020 0218   PCO2ART 37.9 Sep 25, 2020 0218   PO2ART 273 (H) 09-25-2020 0218   HCO3 21.8 09/25/20 0218   TCO2 23 09/25/20 0218   ACIDBASEDEF 3.0 (H) 2020-09-25 0218   O2SAT 100.0 2020-09-25 0218     Coagulation Profile: Recent Labs  Lab 25-Sep-2020 0441 08/28/20 1140  INR 2.6* 1.4*    Cardiac Enzymes: Recent Labs  Lab September 25, 2020 0116 09/25/2020 0909 08/28/20 0345  CKTOTAL 1,130* 1,983* 1,117*    HbA1C: Hgb A1c MFr Bld  Date/Time Value Ref Range Status  25-Sep-2020 09:09 AM 12.1 (H) 4.8 - 5.6 % Final    Comment:    (NOTE) Pre diabetes:          5.7%-6.4%  Diabetes:              >6.4%  Glycemic control for   <7.0% adults with diabetes     CBG: Recent Labs  Lab 08/28/20 1932 08/28/20 2340 08/29/20 0428 08/29/20 0740 08/29/20 1124  GLUCAP 132* 133* 167* 164* 162*    Review of Systems:   Unable to obtain due to neuro status  Past Medical History:  She,  has a past medical history of Brain aneurysm, DM w/o complication type II (HCC), Hypercholesteremia, and Hypertension.   Surgical History:   Past Surgical History:  Procedure Laterality Date   BRAIN SURGERY     States they went through her  stomach to repair  bleed in her head in '05   CEREBRAL ANEURYSM REPAIR     in 2005     Social History:   reports that she  has never smoked. She has never used smokeless tobacco. She reports that she does not drink alcohol and does not use drugs.   Family History:  Her family history includes Aneurysm in her sister; CVA in her mother; Cancer in her brother and brother; Diabetes in her father; Heart attack in her father; Heart disease in her father; Heart failure in her mother; Hypertension in her mother. There is no history of Other.   Allergies No Known Allergies   Home Medications  Prior to Admission medications   Medication Sig Start Date End Date Taking? Authorizing Provider  aspirin EC 81 MG tablet Take 81 mg by mouth See admin instructions. Take 1 tablet (81 mg) daily, take an additional 1 tablet daily if needed for pain 10/24/11   Brent Bulla, MD  cloNIDine (CATAPRES - DOSED IN MG/24 HR) 0.2 mg/24hr patch APPLY 1 PATCH TOPICALLY ONCE A WEEK 09/19/17   [provider]  ibuprofen (ADVIL,MOTRIN) 400 MG tablet Take 1 tablet (400 mg total) by mouth every 6 (six) hours as needed. 07/29/14   Linwood Dibbles, MD  lisinopril-hydrochlorothiazide (ZESTORETIC) 20-12.5 MG tablet Take 1 tablet by mouth daily. 10/23/15   Hayden Rasmussen, NP  metFORMIN (GLUCOPHAGE) 1000 MG tablet Take one tablet po with evening meal 10/23/15   Hayden Rasmussen, NP  simvastatin (ZOCOR) 20 MG tablet Take 20 mg by mouth.  11/03/13   [provider]   The patient is critically ill with multiple organ systems failure and requires high complexity decision making for assessment and support, frequent evaluation and titration of therapies, application of advanced monitoring technologies and extensive interpretation of multiple databases. Critical Care Time devoted to patient care services described in this note independent of APP/resident time (if applicable)  is 30 minutes.   Virl Diamond MD [facility]  Critical [facility] Personal pager: See Amion If unanswered, please page CCM On-call: [phone]

## 2020-08-29 NOTE — Procedures (Addendum)
Patient Name: Samantha [name]  MRN: [id number]  Epilepsy Attending: Charlsie Quest  Referring Physician/Provider: Dr Erick Blinks Duration: 08/28/2020 6294 to 08/29/2020 0748   Patient history: 60 y.o. 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 Found to have a large L MCA stroke with cerebral edema. She is outside the window for any intervention and her stroke appears completed. She was also noted to have twitching movements concerning for a seizure.  EEG to evaluate for seizures.   Level of alertness:  comatose   AEDs during EEG study: LEV   Technical aspects: This EEG study was done with scalp electrodes positioned according to the 10-20 International system of electrode placement. Electrical activity was acquired at a sampling rate of 500Hz  and reviewed with a high frequency filter of 70Hz  and a low frequency filter of 1Hz . EEG data were recorded continuously and digitally stored.    Description: No clear posterior dominant rhythm was seen.  Sleep was characterized by sleep spindles (12 to 14 Hz), maximal frontocentral region. EEG showed continuous generalized and lateralized left hemisphere sharply contoured 5 to 6 Hz theta slowing as well as intermittent generalized 2 to 3 Hz delta slowing.  Generalized and maximal right frontal periodic discharges with triphasic morphology at 1-1.5 Hz were also noted, more prominent when awake/stimulated. Hyperventilation and photic stimulation were not performed.      ABNORMALITY - Continuous slow, generalized and lateralized left hemisphere -Periodic discharges with triphasic morphology, generalized and maximal right frontal   IMPRESSION: This study showed periodic discharges with triphasic morphology at 1-1.5 Hz which is on the ictal-interictal continuum with low potential for seizures. This EEG pattern can also be seen with toxic  -metabolic causes.  Additionally there is cortical dysfunction in left hemisphere likely secondary to underlying structural abnormality/stroke. There is also moderate to severe diffuse encephalopathy, nonspecific etiology.  No seizures were seen throughout the recording.   Samantha [name] 

## 2020-08-29 NOTE — Progress Notes (Signed)
Discontinued cEEG study.  Notified [facility] monitoring.  No skin breakdown observed. 

## 2020-08-29 NOTE — Progress Notes (Signed)
EEG discontinued. Per Stroke MD's request, I reached out to MRI. MRI unable to take patient at this time and will call RN when they have an opening.

## 2020-08-29 NOTE — Progress Notes (Addendum)
NAME:  Samantha [name], MRN:  [id number], DOB:  April 01, 1960, LOS: 2 ADMISSION DATE:  08/28/2020, CONSULTATION DATE:  08/10/2020 REFERRING MD: Dr. Daun Peacock, CHIEF COMPLAINT:  ALOC   History of Present Illness:  60 year old F that presented to the ER on 8/21 after being found down at home.  Family had not heard from the patient in 2 days. They went to the home and saw her on the bathroom floor through the window.  She was in respiratory distress, intubated on arrival to ER.  It was noted that her right arm and leg were completely flaccid prior to intubation.  Patient has a long history of hypertension. Initial workup significant for large L MCA territory infarct, likely subacute, without hemorrhage.  CT C-spine without acute findings.  Labs showed an initial glucose of >600, creatinine 2.15 (baseline <1), WBC 19k, CK 1130.  Pertinent  Medical History  Hypertension Cerebral aneurysm Diabetes mellitus type 2 Hyperlipidemia  Significant Hospital Events: Including procedures, antibiotic start and stop dates in addition to other pertinent events   8/21 Brought in from home, intubated, admit to ICU, R IJ CVC placed 8/21 Initial head CT with large L MCA infarct, likely subacute. ECHO with LVEF 65-70%, no RWMA, severe LVH, grade I diastolic dysfunction  8/22 EEG w/o seizure overnight. Off propofol since 10am.  8/23 Abx narrowed to cefazolin  Subjective   Tmax 100.2  Pending decision for MRI per Neurology  Blood cultures growing klebsiella pneumonia   Objective   Blood pressure (!) 186/70, pulse 82, temperature 100.2 F (37.9 C), resp. rate (!) 24, height 5\' 1"  (1.549 m), weight 87 kg, SpO2 100 %.    Vent Mode: PRVC FiO2 (%):  [40 %] 40 % Set Rate:  [18 bmp] 18 bmp Vt Set:  [400 mL] 400 mL PEEP:  [5 cmH20] 5 cmH20 Plateau Pressure:  [16 cmH20-17 cmH20] 17 cmH20   Intake/Output Summary (Last 24 hours) at 08/29/2020 0903 Last data filed at 08/29/2020 0800 Gross per 24 hour  Intake 3130.64 ml   Output 875 ml  Net 2255.64 ml   Filed Weights   08/28/2020 0334 08/28/20 0500 08/29/20 0500  Weight: 86.2 kg 86.2 kg 87 kg   Exam: General: critically ill appearing adult female lying in bed on vent HEENT: MM pink/moist, ETT, poor dentition, anicteric.  Noted flushing of face Neuro: off sedation, spontaneously moves left arm/leg to voice but does not follow commands, no movement on right side - unable to hold arm up against gravity, no withdrawal to pain CV: s1s2 RRR, burst of tachycardia while in room into 130's, no m/r/g PULM:  non-labored on vent, lungs bilaterally clear  GI: soft, bsx4 active  Extremities: warm/dry, trace to 1+ dependent edema  Skin: no rashes or lesions   Assessment & Plan:   Left MCA CVA Found down at home, LKW 2 days prior to admit.  Noted PSVT while in room with patient 8/23, ? AF as source.  -appreciate Neurology  -continue EEG -planned MRI 8/23 if no further seizures on EEG  -SBP goal <180 -hypernatremia s/p 3%, follow trend, NS held 0600 per [facility] -hold lipitor 80 mg 8/23, recheck CK in am and consider restart   Acute Respiratory Failure secondary to L MCA CVA -PRVC 8cc/kg  -wean PEEP / fiO2 for sats >90% -daily SBT  -minimize sedation to allow for neuro exam  -VAP prevention meaures -intermittent CXR   Klebsiella Bacteremia  Fever Unclear source  -change abx to cefazolin  -follow fever  curve / WBC trend  -discontinue foley   ?AF/PSVT  Noted on exam 8/23 -follow tele  Rule Out Seizure  -propofol for sedation if needed  -EEG  -continue keppra  -seizure precautions   HHS -sliding scale insulin for glucose 140-180 -standard scale, levemir 15 units BID   Severe hypertension -add norvasc 5mg  PT   Mild Rhabdomyolysis Acute Kidney Injury -Trend BMP / urinary output -Replace electrolytes as indicated -Avoid nephrotoxic agents, ensure adequate renal perfusion   Best Practice (right click and "Reselect all SmartList Selections"  daily)  Diet/type: NPO DVT prophylaxis: LMWH GI prophylaxis: PPI Lines: Central line Foley:  Yes, and it is still needed Code Status:  full code  Critical Care Time: 36 minutes    , MSN, APRN, NP-C, AGACNP-BC [facility] 08/29/2020, 9:03 AM   Please see [email or web address] for pager details.   From 7A-7P if no response, please call [phone] After hours, please call ELink [phone]

## 2020-08-29 NOTE — Progress Notes (Signed)
eLink Physician-Brief Progress Note Patient Name: Samantha [name] DOB: 05-01-1960 MRN: [id number]   Date of Service  08/29/2020  HPI/Events of Note  CVA, out of window for tPA. Neuro notes: BP goals < 180. Running elevated. Cr 1.6  eICU Interventions  Hydralazine 5 mg q1 hr x 2 for SBP > 180 ordered.      Intervention Category Intermediate Interventions: Hypertension - evaluation and management  Ranee Gosselin 08/29/2020, 5:30 AM

## 2020-08-29 NOTE — Progress Notes (Signed)
eLink Physician-Brief Progress Note Patient Name: Samantha [name] DOB: 01-20-1960 MRN: [id number]   Date of Service  08/29/2020  HPI/Events of Note  Na 161    on vent with OG and c-line  eICU Interventions  Hold NS at 75 ml/hr. Follow sodium level at 9 AM.  On TF 30 ml q4 hr. CVA.  Consider half NS in AM, if ok by Neurology .      Intervention Category Intermediate Interventions: Electrolyte abnormality - evaluation and management  Ranee Gosselin 08/29/2020, 6:14 AM

## 2020-08-29 NOTE — Progress Notes (Signed)
STROKE TEAM PROGRESS NOTE   Interval History   RN at bedside.  Patient still intubated, not responsive, not follow commands, no significant neuro change.  Off propofol since yesterday.  EEG showed periodic discharges with triphasic morphology with low potential for seizures.  No seizures seen throughout the recording.  Will DC long-term EEG, proceed with MRI and MRA.  Palliative care on board, discussed with family, they would like to see how patient condition for the next couple days and then make decision.  Pertinent Lab Work and Imaging    08/11/2020 CT Head WO IV Contrast Acute left MCA territory infarct without hemorrhagic conversion or progressive swelling. Midline shift measures 4 mm.  Physical Examination   Temp:  [99.7 F (37.6 C)-100.8 F (38.2 C)] 100.1 F (37.8 C) (08/23 1536) Pulse Rate:  [74-103] 89 (08/23 1400) Resp:  [0-28] 28 (08/23 1400) BP: (136-199)/(57-78) 163/57 (08/23 1400) SpO2:  [92 %-100 %] 97 % (08/23 1400) FiO2 (%):  [40 %] 40 % (08/23 1200) Weight:  [87 kg] 87 kg (08/23 0500)  General - Well nourished, well developed, intubated off sedation.  Ophthalmologic - fundi not visualized due to noncooperation.  Cardiovascular - Regular rate and rhythm.  Neuro - intubated off sedation, eyes closed, not following commands. With forced eye opening, eyes in mid position, not blinking to visual threat, doll's eyes sluggish but present, not tracking, pupil equal size 2.5 mm, sluggish to light. Corneal reflex weakly present bilaterally, gag and cough present. Breathing over the vent.  Facial symmetry not able to test due to ET tube.  Tongue protrusion not cooperative. On pain stimulation, mild withdraw left upper and lower extremity, but no movement of right upper and lower extremity. DTR 1+ and no babinski. Sensation, coordination and gait not tested.  Assessment and Plan   Samantha [name] is a 60 y.o. 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   Stroke - L MCA large infarct, embolic pattern, source unclear, concerning for multiple uncontrolled stroke risk factors.  However, cardioembolic source cannot be ruled out at this time.  CT head left large MCA infarct with MLS 59mm Repeat CT head stable infarct and MLS 3mm MRI Head pending MRA pending CUS left ICA unremarkable BLE doppler no DVT Echo EF 65-70% LDL 158 hemoglobin a1c 12.1  Not on antithrombotics PTA, now on ASA 325.  PT OT recommendation pending Dispo pending  Cerebral edema CT x 2 stable MLS 25mm Was on 3% saline, now off given Na at goal on TF Na monitoring MRI pending   Seizure  Presented with right face and arm twitching Loaded with keppra LTM EEG 8/22 - Continuous slow, generalized and lateralized left hemisphere LTM EEG 8/23 - periodic discharges with triphasic morphology with low potential for seizures.  No seizures seen throughout the recording.  Continue keppra 500mg  bid  Respiratory failure Intubated on vent CCM on board Off propofol May need Trach if decided on aggressive care  Pseudohyponatremia, resolved Due to sever hyperglycemia Na level arise after correction of hypoglycemia Off 3% saline -> NS @ 75-> off Na 141-146-152-157-159 -160  Uncontrolled DM HHS CCM on board A1C 12.1 Insulin drip -> levemir  SSI CBG monitoring CCM on board  Hypertension Home meds - lisinopril-hctz, clonidine patch at home.  Stable now BP goal <  180/105 LTM BP goal normotensive  Hyperlipidemia Not on statin PTA LDL goal is < 70. LDL is 158 On Atorvastatin 80 mg Continue statin on discharge  Rhabdomyolysis Dehydration AKI Cre 2.15->1.86->1.33->1.68->1.35 On TF CCM on board BMP monitoring  Bacteremia Sepsis  Blood Cx - Klebsiella pneumoniae Ancef started 08/29/20 T Max 100.8->100.4 WBC's -  20.9->15.4  Paroxysmal SVT CCM following  Palliative Care Consult DNR Time trial of treatments for 72 hours, if not improvements are made further conversations on comfort measures will take place Ongoing PMT support   Hospital day # 2  This patient is critically ill due to large left MCA infarct, seizure, HHS, bacteremia, AKI, rhabdomyolysis, paroxysmal SVT and at significant risk of neurological worsening, death form sepsis, septic shock, status epilepticus, recurrent stroke, hemorrhagic conversion, cerebral edema, brain herniation, renal failure. This patient's care requires constant monitoring of vital signs, hemodynamics, respiratory and cardiac monitoring, review of multiple databases, neurological assessment, discussion with family, other specialists and medical decision making of high complexity. I spent 35 minutes of neurocritical care time in the care of this patient.   Marvel Plan, MD PhD Stroke Neurology 08/29/2020 10:03 PM     To contact Stroke Continuity provider, please refer to [email or web address]. After hours, contact General Neurology

## 2020-08-29 NOTE — Consult Note (Signed)
Palliative Medicine Inpatient Consult Note  Consulting Provider: Gleason, Otilio Carpen, PA-C  Reason for consult:   Samantha [name] Palliative Medicine Consult  Reason for Consult? goals of care discussion following large stroke   HPI:  Per intake H&P --> 60 year old black female that presented to the emergency room from home.  The patient was found down in her bathroom.  She is unconscious unresponsive.  Family had not heard from her for 2 days therefore they came to the house and saw her on the floor through the bathroom window.  911/EMS was called.  Patient was found to have significant respiratory distress not able to protect her airway therefore was intubated on arrival to the emergency room.  It was noted that her right arm and leg were completely flaccid.  Prior to intubation.  Patient has a long history of hypertension.   Palliative care has been requested to aide in further goals of care conversations in the setting of a large left MCA infarction.   Clinical Assessment/Goals of Care:  *Please note that this is a verbal dictation therefore any spelling or grammatical errors are due to the "[place] One" system interpretation.  I have reviewed medical records including EPIC notes, labs and imaging, received report from bedside RN, assessed the patient who is lying in bed, non-responsive.    I met with patients nephew, Samantha [name] and called his niece, Samantha [name] on speaker-phone to further discuss diagnosis prognosis, GOC, EOL wishes, disposition and options.  WE reviewed patients chart and her past medical history inclusive of her history of HTN, cerebral aneurysm, Type 2DM, and HLD. Reviewed that her health overall is not great though she had been recently able to care for herself which was a transition from living with her niece Samantha [name] for > 15 years.   I introduced Palliative Medicine as specialized medical care for people living with serious illness. It focuses on  providing relief from the symptoms and stress of a serious illness. The goal is to improve quality of life for both the patient and the family. We discussed that in the setting of Timmie suffering a significant stroke it was important that we further determine what is important to the patients so that we make decisions to honor her wishes.   Samantha [name] is from [place]. She was married years ago but her husband is now deceased. She never had any children. 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 She enjoyed for recreation going to [employer], casinos, and playing on the computer. She is a woman of faith and practices within the [facility] denomination.   Prior to admission, Samantha [name] had been living independently in senior apartments. Her niece, Samantha [name] would check in on her almost daily. Samantha [name] had been for the most part well functioning.   A review of Samantha [name]'s present stroke was completed. Per Samantha [name], she understands that it is a significant stroke and that if the "brain swelling" does not improve that Samantha [name] would not improve nor will she have great quality in her life. WE discussed the concerns that Samantha [name] is off sedation and not responding as we would hope. I shared that she will likely be someone who is dependent on care for the rest of her life in the best circumstances. Samantha [name] and Samantha [name] agree that Samantha [name] would not want this nor would she wish to be in a prolong vegetative state.   A detailed discussion was had today regarding advanced directives- Samantha [name] is patient primary decision maker and her  nephew Samantha [name] agrees to this.    Concepts specific to code status, artifical feeding and hydration, continued IV antibiotics and rehospitalization was had.  Patient is DNAR presently, would not wish for additional heroic measures if improvements were not to be seen.   The difference between a aggressive medical intervention path  and a palliative comfort care path for this patient at this time was  had. WE discussed continuing the present measures of support until Friday and if not improvements are made by then to transition our focus to a comfort emphasis.   Discussed the importance of continued conversation with family and their  medical providers regarding overall plan of care and treatment options, ensuring decisions are within the context of the patients values and GOCs.  Decision Maker: Samantha [name] (niece) [phone]  SUMMARY OF RECOMMENDATIONS   DNAR  Time trial of treatments for 72 hours, if not improvements are made further conversations on comfort measures will take place  Plan to meet with Samantha [name] and Samantha [name] again on Friday at 1200  Ongoing PMT support  Code Status/Advance Care Planning: DNAR  Palliative Prophylaxis:  Oral Care, Turn Q2H  Additional Recommendations (Limitations, Scope, Preferences): Continue current scope of care for the time being   Psycho-social/Spiritual:  Desire for further Chaplaincy support: Yes [facility] Additional Recommendations: Education on acute strokes   Prognosis: Presently patient remains quite obtunded and is off sedation. Another MRI is pending for further prognostication.  Discharge Planning: Unclear  Vitals:   08/29/20 0600 08/29/20 0615  BP: (!) 186/67 (!) 186/68  Pulse: 82 82  Resp: (!) 24 (!) 24  Temp: 100.2 F (37.9 C) 100.2 F (37.9 C)  SpO2: 100% 100%    Intake/Output Summary (Last 24 hours) at 08/29/2020 0655 Last data filed at 08/29/2020 0600 Gross per 24 hour  Intake 3417.7 ml  Output 935 ml  Net 2482.7 ml   Last Weight  Most recent update: 08/29/2020  5:48 AM    Weight  87 kg (191 lb 12.8 oz)            Gen:  Middle aged [place] F very ill appearing HEENT: ETT and NGT, dry mucous membranes CV: Regular rate and irregular rhythm  PULM: Mechanical ventilator ABD: soft/nontender  EXT: (+) generalized edema Neuro: Obtunded  PPS: 10%  This conversation/these recommendations were discussed with  patient primary care team, Dr. Ander Slade  Time In: 1210 Time Out: 1320 Total Time: 70 Greater than 50%  of this time was spent counseling and coordinating care related to the above assessment and plan.  [place] Team Team Cell Phone: [phone] Please utilize secure chat with additional questions, if there is no response within 30 minutes please call the above phone number  Palliative Medicine Team providers are available by phone from 7am to 7pm daily and can be reached through the team cell phone.  Should this patient require assistance outside of these hours, please call the patient's attending physician.

## 2020-08-30 ENCOUNTER — Inpatient Hospital Stay (HOSPITAL_COMMUNITY): Payer: Self-pay

## 2020-08-30 DIAGNOSIS — J9601 Acute respiratory failure with hypoxia: Secondary | ICD-10-CM

## 2020-08-30 DIAGNOSIS — R569 Unspecified convulsions: Secondary | ICD-10-CM

## 2020-08-30 LAB — BASIC METABOLIC PANEL
BUN: 35 mg/dL — ABNORMAL HIGH (ref 6–20)
CO2: 23 mmol/L (ref 22–32)
Calcium: 8.7 mg/dL — ABNORMAL LOW (ref 8.9–10.3)
Chloride: >130 mmol/L (ref 98–111)
Creatinine, Ser: 1.24 mg/dL — ABNORMAL HIGH (ref 0.44–1.00)
GFR, Estimated: 51 mL/min — ABNORMAL LOW (ref >60)
Glucose, Bld: 178 mg/dL — ABNORMAL HIGH (ref 70–99)
Potassium: 3.4 mmol/L — ABNORMAL LOW (ref 3.5–5.1)
Sodium: 160 mmol/L — ABNORMAL HIGH (ref 135–145)

## 2020-08-30 LAB — GLUCOSE, CAPILLARY
Glucose-Capillary: 145 mg/dL — ABNORMAL HIGH (ref 70–99)
Glucose-Capillary: 152 mg/dL — ABNORMAL HIGH (ref 70–99)
Glucose-Capillary: 162 mg/dL — ABNORMAL HIGH (ref 70–99)
Glucose-Capillary: 163 mg/dL — ABNORMAL HIGH (ref 70–99)
Glucose-Capillary: 171 mg/dL — ABNORMAL HIGH (ref 70–99)
Glucose-Capillary: 176 mg/dL — ABNORMAL HIGH (ref 70–99)
Glucose-Capillary: 178 mg/dL — ABNORMAL HIGH (ref 70–99)
Glucose-Capillary: 183 mg/dL — ABNORMAL HIGH (ref 70–99)

## 2020-08-30 LAB — CBC
HCT: 38.2 % (ref 36.0–46.0)
Hemoglobin: 11.7 g/dL — ABNORMAL LOW (ref 12.0–15.0)
MCH: 27.4 pg (ref 26.0–34.0)
MCHC: 30.6 g/dL (ref 30.0–36.0)
MCV: 89.5 fL (ref 80.0–100.0)
Platelets: 186 10*3/uL (ref 150–400)
RBC: 4.27 MIL/uL (ref 3.87–5.11)
RDW: 15.3 % (ref 11.5–15.5)
WBC: 13.9 10*3/uL — ABNORMAL HIGH (ref 4.0–10.5)
nRBC: 0 % (ref 0.0–0.2)

## 2020-08-30 LAB — CK: Total CK: 900 U/L — ABNORMAL HIGH (ref 38–234)

## 2020-08-30 LAB — TRIGLYCERIDES: Triglycerides: 145 mg/dL (ref <150)

## 2020-08-30 MED ORDER — ASPIRIN 325 MG PO TABS
325.0000 mg | ORAL_TABLET | Freq: Every day | ORAL | Status: DC
  Administered 2020-08-30 – 2020-09-02 (×4): 325 mg
  Filled 2020-08-30 (×4): qty 1

## 2020-08-30 MED ORDER — HYDRALAZINE HCL 20 MG/ML IJ SOLN
10.0000 mg | INTRAMUSCULAR | Status: DC | PRN
  Administered 2020-08-30 – 2020-09-01 (×4): 20 mg via INTRAVENOUS
  Filled 2020-08-30 (×4): qty 1

## 2020-08-30 MED ORDER — LEVETIRACETAM 100 MG/ML PO SOLN
500.0000 mg | Freq: Two times a day (BID) | ORAL | Status: DC
  Administered 2020-08-30 – 2020-09-02 (×6): 500 mg
  Filled 2020-08-30 (×6): qty 5

## 2020-08-30 MED ORDER — ATORVASTATIN CALCIUM 40 MG PO TABS
80.0000 mg | ORAL_TABLET | Freq: Every day | ORAL | Status: DC
  Administered 2020-08-31 – 2020-09-01 (×2): 80 mg
  Filled 2020-08-30 (×2): qty 2

## 2020-08-30 MED ORDER — PANTOPRAZOLE SODIUM 40 MG PO PACK
40.0000 mg | PACK | Freq: Every day | ORAL | Status: DC
  Administered 2020-08-30 – 2020-09-02 (×4): 40 mg
  Filled 2020-08-30 (×4): qty 20

## 2020-08-30 MED ORDER — POTASSIUM CHLORIDE 20 MEQ PO PACK
40.0000 meq | PACK | Freq: Once | ORAL | Status: AC
  Administered 2020-08-30: 40 meq
  Filled 2020-08-30: qty 2

## 2020-08-30 MED ORDER — HEPARIN SODIUM (PORCINE) 5000 UNIT/ML IJ SOLN
5000.0000 [IU] | Freq: Three times a day (TID) | INTRAMUSCULAR | Status: DC
  Administered 2020-08-30 – 2020-09-02 (×10): 5000 [IU] via SUBCUTANEOUS
  Filled 2020-08-30 (×10): qty 1

## 2020-08-30 NOTE — Progress Notes (Signed)
STROKE TEAM PROGRESS NOTE   Interval History  No family is at the bedside. Pt still intubated on vent, no significant neuro changes. Off EEG and MRI overnight showed large left MCA infarct with MLS 1.2cm and petechial hemorrhage. CT head showed no hematoma. Na 160.   Pertinent Lab Work and Imaging    2020-09-23 CT Head WO IV Contrast Acute left MCA territory infarct without hemorrhagic conversion or progressive swelling. Midline shift measures 4 mm.  CT HEAD WITHOUT CONTRAST 08/30/20 IMPRESSION: 1. Large left hemisphere infarct involving the entire Left MCA territory. Petechial hemorrhage but no malignant hemorrhagic transformation. 2. Increased intracranial mass effect since 09/23/20, now with 12 mm rightward midline shift and trapping of the right temporal horn-stable from the MRI at 0228 hours today. 3. Previous right Pcomm region aneurysm coil embolization.  MRI HEAD  08/30/20 IMPRESSION: 1. Large evolving early subacute ischemic left MCA distribution infarct involving the entirety of the left MCA territory. Associated petechial hemorrhage throughout the area of infarction. Additionally, superimposed heterogeneous T1 hyperintensity at the level of the left basal ganglia raises the possibility for hemorrhagic transformation. Correlation with dedicated head CT recommended. 2. Marked interval worsening in left-to-right midline shift now measuring up to 13 mm. 3. Few additional punctate foci of acute to early subacute ischemia involving the subcortical right parietal lobe, right basal ganglia, and ventral right midbrain as above.   MRA HEAD  08/30/20 IMPRESSION: 1. Occlusion of the left M1 segment at its origin. No discernible flow distally within the left MCA distribution. 2. Moderate atheromatous change elsewhere throughout the intracranial circulation. Associated severe multifocal stenoses about the carotid siphons, with additional moderate left P2 stenosis as above. 3.  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 Probable small 2 mm remnant at the base of the coil pack.  EEG 08/30/20 ABNORMALITY - Continuous slow, generalized and lateralized left hemisphere -Periodic discharges with triphasic morphology, generalized and maximal right frontal IMPRESSION: This study showed periodic discharges with triphasic morphology at 1-1.5 Hz which is on the ictal-interictal continuum with low potential for seizures. This EEG pattern can also be seen with toxic -metabolic causes.  Additionally there is cortical dysfunction in left hemisphere likely secondary to underlying structural abnormality/stroke. There is also moderate to severe diffuse encephalopathy, nonspecific etiology.  No seizures were seen throughout the recording.  Physical Examination   Temp:  [98 F (36.7 C)-98.9 F (37.2 C)] 98.1 F (36.7 C) (08/24 1130) Pulse Rate:  [60-93] 60 (08/24 1135) Resp:  [16-28] 18 (08/24 1135) BP: (132-198)/(49-76) 134/49 (08/24 1100) SpO2:  [97 %-100 %] 98 % (08/24 1135) FiO2 (%):  [40 %] 40 % (08/24 1536) Weight:  [87.5 kg] 87.5 kg (08/24 0500)  General - Well nourished, well developed, intubated not on sedation.  Ophthalmologic - fundi not visualized due to noncooperation.  Cardiovascular - Regular rate and rhythm.  Neuro - intubated not on sedation, eyes closed, not following commands. With forced eye opening, eyes in mid position, not blinking to visual threat, doll's eyes present, not tracking, pupil equal size 4 mm, reactive to light. Corneal reflex present on the left but weakly present on the right, gag and cough present. Breathing over the vent.  Facial symmetry not able to test due to ET tube.  Tongue protrusion not cooperative. On pain stimulation, withdraw left UE, able to against gravity, withdraw LLE 2/5, but no movement of right upper and lower extremity. DTR 1+ and no babinski. Sensation, coordination and gait not tested.  Assessment and  Plan   Samantha [name] is a 60 y.o. 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   Stroke - L MCA large infarct, embolic pattern, source unclear, concerning for multiple uncontrolled stroke risk factors.  However, cardioembolic source cannot be ruled out at this time.  CT head 8/21 left large MCA infarct with MLS 41mm Repeat CT head stable infarct and MLS 51mm MRI Head 08/30/20 - Marked interval worsening in left-to-right midline shift now measuring up to 13 mm. Large left MCA infarct with petechial HT  MRA Head 08/30/20 - Occlusion of the left M1 segment at its origin. No discernible flow distally within the left MCA distribution.  CT Head 08/30/20 - 12 mm rightward midline shift and trapping of the right temporal.   CUS left ICA unremarkable BLE doppler no DVT Echo EF 65-70% LDL 158 hemoglobin a1c 12.1  DVT prophylaxis - heparin subq Not on antithrombotics PTA, now on ASA 325. OK to continue Prognosis poor - palliative care on board, family would like to see any improvement for the next a couple of days.  Cerebral edema CT x 2 stable MLS 30mm MRI 8/24 severe cerebral edema with MLS 1.3 cm Was on 3% saline, now off given Na at goal on TF Na 160 Not candidate for hemicrani Poor prognosis, palliative care on board  Seizure  Presented with right face and arm twitching Loaded with keppra LTM EEG 8/22 - Continuous slow, generalized and lateralized left hemisphere LTM EEG 8/23 - periodic discharges with triphasic morphology with low potential for seizures.  No seizures seen throughout the recording.  Off LTM EEG Continue keppra 500mg  bid -> changed to po  Respiratory failure Intubated on vent CCM on board Off propofol will need Trach if decided on aggressive care  Pseudohyponatremia, resolved Due to sever hyperglycemia Na level  arise after correction of hypoglycemia Off 3% saline -> NS @ 75-> off Na 141-146-152-157-159 -160 -160  Uncontrolled DM HHS CCM on board A1C 12.1 Insulin drip -> levemir  SSI CBG monitoring CCM on board  Hypertension Home meds - lisinopril-hctz, clonidine patch at home.  Stable now BP goal < 180/105 Labetalol as needed LTM BP goal normotensive  Hyperlipidemia Not on statin PTA LDL goal is < 70. LDL is 158 On Atorvastatin 80 mg Continue statin on discharge  Rhabdomyolysis Dehydration AKI Cre 2.15->1.86->1.33->1.68->1.35->1.24 On TF CCM on board BMP monitoring  Bacteremia Sepsis  Blood Cx - Klebsiella pneumoniae Ancef started 08/29/20 T Max 100.8->100.4->afebrile WBC's - 20.9->15.4->13.9  Paroxysmal SVT CCM following  Palliative Care Consult DNR Time trial of treatments for 72 hours, if not improvements are made further conversations on comfort measures will take place Ongoing PMT support   Hospital day # 3  This patient is critically ill due to large left MCA infarct, seizure, HHS, bacteremia, AKI, cerebral edema and at significant risk of neurological worsening, death form brain herniation, sepsis, septic shock, status epilepticus, recurrent stroke, hemorrhagic conversion, renal failure. This patient's care requires constant monitoring of vital signs, hemodynamics, respiratory and cardiac monitoring, review of multiple databases, neurological assessment, discussion with family, other specialists and medical decision making of high complexity. I spent 35 minutes of neurocritical care time in the care of this patient.  I discussed with CCM NP.  08/31/20, MD PhD Stroke  Neurology 08/30/2020 5:43 PM  To contact Stroke Continuity provider, please refer to [email or web address]. After hours, contact General Neurology

## 2020-08-30 NOTE — Progress Notes (Signed)
Patient transported to MRI on 100% FIO2 via mechanical ventilation.

## 2020-08-30 NOTE — Progress Notes (Signed)
Called niece for a second time - no answer.  Will follow up with family.     Canary Brim, MSN, APRN, NP-C, AGACNP-BC [facility] 08/30/2020, 1:38 PM   Please see [email or web address] for pager details.   From 7A-7P if no response, please call [phone] After hours, please call ELink [phone]

## 2020-08-30 NOTE — Progress Notes (Signed)
Patient transported to CT scan and back to 2M13 without incidence.

## 2020-08-30 NOTE — Procedures (Signed)
Patient Name: Samantha [name]  MRN: [id number]  Epilepsy Attending: Charlsie Quest  Referring Physician/Provider: Dr Erick Blinks Duration: 08/29/2020 9892 to 08/29/2020 1653   Patient history: 60 y.o. 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 Found to have a large L MCA stroke with cerebral edema. She is outside the window for any intervention and her stroke appears completed. She was also noted to have twitching movements concerning for a seizure.  EEG to evaluate for seizures.   Level of alertness:  comatose   AEDs during EEG study: LEV   Technical aspects: This EEG study was done with scalp electrodes positioned according to the 10-20 International system of electrode placement. Electrical activity was acquired at a sampling rate of 500Hz  and reviewed with a high frequency filter of 70Hz  and a low frequency filter of 1Hz . EEG data were recorded continuously and digitally stored.    Description: No clear posterior dominant rhythm was seen.  Sleep was characterized by sleep spindles (12 to 14 Hz), maximal frontocentral region. EEG showed continuous generalized and lateralized left hemisphere sharply contoured 5 to 6 Hz theta slowing as well as intermittent generalized 2 to 3 Hz delta slowing.  Generalized and maximal right frontal periodic discharges with triphasic morphology at 1-1.5 Hz were also noted, more prominent when awake/stimulated. Hyperventilation and photic stimulation were not performed.      ABNORMALITY - Continuous slow, generalized and lateralized left hemisphere -Periodic discharges with triphasic morphology, generalized and maximal right frontal   IMPRESSION: This study showed periodic discharges with triphasic morphology at 1-1.5 Hz which is on the ictal-interictal continuum with low potential for seizures. This EEG pattern can also be seen with toxic  -metabolic causes.  Additionally there is cortical dysfunction in left hemisphere likely secondary to underlying structural abnormality/stroke. There is also moderate to severe diffuse encephalopathy, nonspecific etiology.  No seizures were seen throughout the recording.   Nichael Ehly 

## 2020-08-30 NOTE — Progress Notes (Signed)
Hall Busing called to arrange family meeting, no answer.  Phone rang but no voicemail prompt.  Will try to reach her again.     Michelle Nasuti - Aunt [phone]. Updated at bedside on concerns, will review with family when available.     Canary Brim, MSN, APRN, NP-C, AGACNP-BC [facility] 08/30/2020, 9:36 AM   Please see [email or web address] for pager details.   From 7A-7P if no response, please call [phone] After hours, please call ELink [phone]

## 2020-08-30 NOTE — Progress Notes (Signed)
This Rn notified MD Lindzen, of NA+160, Chlorid>130,This RN has obtained MRI and CT scan.MD is aware and to review results. This Rn inquired about SQ heparin. MD stated to proceed with administration of SQ heparin. This Rn will continue to monitor patient closely and await further orders.

## 2020-08-30 NOTE — Progress Notes (Signed)
Patient transported back to 2M13. No complications.

## 2020-08-30 NOTE — Progress Notes (Addendum)
MRI brain completed, revealing marked interval worsening in left-to-right midline shift relative to prior CT, shift now measuring up to 13 mm. Area of infarction is unchanged, with involvement of essentially the entire left MCA territory. There is associated petechial hemorrhage throughout the area of infarction. Additionally, superimposed heterogeneous T1 hyperintensity at the level of the left basal ganglia raises the possibility for hemorrhagic transformation. There are a few additional punctate foci of acute to early subacute ischemia involving the subcortical right parietal lobe, right basal ganglia, and ventral right midbrain.   MRA head reveals occlusion of the left M1 segment at its origin. There is no discernible flow distally within the left MCA distribution. Moderate atheromatous change elsewhere throughout the intracranial circulation. Associated severe multifocal stenoses about the carotid siphons, with additional moderate left P2 stenosis. Also noted is imaging sequela of prior coil embolization of a right PCOM aneurysm.with probable small 2 mm remnant at the base of the coil pack.  Most recent Na from yesterday morning was 32  A/R: 60 year old female with massive left MCA stroke. MRI brain reveals significantly worsened left to right midline shift.  1. Per Palliative note from yesterday after discussion with the patient's sister Alvy Beal, who is her HCPOA: "Patient is DNAR presently, would not wish for additional heroic measures if improvements were not to be seen." Patient is therefore not a candidate for hemicraniectomy.  2. Previously on hypertonic Na which was later stopped. Most recent Na was 160 from yesterday morning. Obtaining repeat BMP STAT. If Na is below 150, will reorder hypertonic saline.  3. Obtaining STAT CT to reassess the hyperintense left basal ganglia T1 signal per Radiology recommendation. On review of the images by Neurology, the hyperintense signal conforms to the  anatomical boundaries of the left basal ganglia and if it is hemorrhage would be petechial and not gross. The remaining petechial hemorrhage seen on MRI does not appear likely to transform to gross hemorrhage. Will hold ASA for now then restart if CT confirms only petechial hemorrhage.   Addendum: - Repeat Na is 160 - Repeat CT head shows no gross hemorrhage.  - Restarting ASA  Electronically signed: Dr. Caryl Pina   Electronically signed: Dr. Caryl Pina

## 2020-08-30 NOTE — Progress Notes (Signed)
Sub-optimal [facility] Physician-Brief Progress Note Patient Name: Sarinah Doetsch DOB: [birth year] MRN: [id number]   Date of Service  08/30/2020  HPI/Events of Note  Sub-optimal blood pressure control.  eICU Interventions  PRN Hydralazine ordered.        Migdalia Dk 08/30/2020, 10:51 PM

## 2020-08-30 NOTE — Progress Notes (Addendum)
NAME:  Samantha [name], MRN:  [id number], DOB:  [birth year], LOS: 3 ADMISSION DATE:  09/19/20, CONSULTATION DATE:  09-19-2020 REFERRING MD: Dr. Daun Peacock, CHIEF COMPLAINT:  ALOC   History of Present Illness:  60 year old F that presented to the ER on 8/21 after being found down at home.  Family had not heard from the patient in 2 days. They went to the home and saw her on the bathroom floor through the window.  She was in respiratory distress, intubated on arrival to ER.  It was noted that her right arm and leg were completely flaccid prior to intubation.  Patient has a long history of hypertension. Initial workup significant for large L MCA territory infarct, likely subacute, without hemorrhage.  CT C-spine without acute findings.  Labs showed an initial glucose of >600, creatinine 2.15 (baseline <1), WBC 19k, CK 1130.  Pertinent  Medical History  Hypertension Cerebral aneurysm Diabetes mellitus type 2 Hyperlipidemia  Significant Hospital Events: Including procedures, antibiotic start and stop dates in addition to other pertinent events   8/21 Brought in from home, intubated, admit to ICU, R IJ CVC placed 8/21 Initial head CT with large L MCA infarct, likely subacute. ECHO with LVEF 65-70%, no RWMA, severe LVH, grade I diastolic dysfunction  8/22 EEG w/o seizure overnight. Off propofol since 10am.  8/23 Abx narrowed to cefazolin.  MRI/MRA Brain with large evolving early subacute ischemic L MCA distribution infarct involving the entirety of the left MCA territory, associated petechial hemorrhage throughout the area of infarction, possible hemorrhagic transformation in the left basal ganglia, marked worsening of left-to-right midline shift measuring 65mm. 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  EEG showed periodic discharges with triphasic morphology with low potential for seizures  Subjective   Afebrile / WBC  13.9  Vent - PEEP 5, 40% Glucose range 163-182 I/O 2L UOP, - in last 24 hours   Objective   Blood pressure (!) 160/68, pulse 66, temperature 98 F (36.7 C), temperature source Oral, resp. rate 20, height 5\' 1"  (1.549 m), weight 87.5 kg, SpO2 99 %.    Vent Mode: PRVC FiO2 (%):  [40 %] 40 % Set Rate:  [18 bmp] 18 bmp Vt Set:  [400 mL] 400 mL PEEP:  [5 cmH20-8 cmH20] 5 cmH20 Plateau Pressure:  [15 cmH20-17 cmH20] 17 cmH20   Intake/Output Summary (Last 24 hours) at 08/30/2020 0720 Last data filed at 08/30/2020 0700 Gross per 24 hour  Intake 1226.15 ml  Output 2080 ml  Net -853.85 ml   Filed Weights   08/28/20 0500 08/29/20 0500 08/30/20 0500  Weight: 86.2 kg 87 kg 87.5 kg   Exam: General: adult female lying in bed in NAD on vent  HEENT: MM pink/moist, ETT, poor dentition  Neuro: on vent, spontaneous movement on left but dense hemiparesis on right, pupils =/reactive  CV: s1s2 RRR, no m/r/g PULM: non-labored on PSV, lungs bilaterally clear  GI: soft, bsx4 active  Extremities: warm/dry, trace dependent edema  Skin: no rashes or lesions   Assessment & Plan:   Left MCA CVA Hx PCOM Aneurysmal SAH 2005 s/p Endovascular Repair Found down at home, LKW 2 days prior to admit.  Noted PSVT while in room with patient 8/23, ? AF as source. MRI/MRA on 8/23 with worsening findings > 64mm shift, suspected hemorrhagic conversion in areas.   -appreciate Neurology  -defer ASA to Neurology  -follow Na+ trend, note s/p  3% -resume lipitor 8/25  -SBP goal <180 -norvasc 5mg  QD -continue keppra -seizure precautions -Na 160 with worsening edema, suspect poor prognosis   Acute Respiratory Failure secondary to L MCA CVA -PRVC 8cc/kg  -wean PEEP / FiO2 for sats >90% -daily SBT but mental status barrier to extubation  -VAP prevention measures  -intermittent CXR  Klebsiella Bacteremia  Fever Unclear source of bacteremia, suspect secondary to possible aspiration  -continue cefazolin, D4  abx  -follow fever curve / WBC trend   ?AF/PSVT  Noted on exam 8/23 -tele monitoring  HHS -SSI, standard scale  -glucose goal 140-180 -continue levemir 15 units BID   Hypertension -norvasc as above   Mild Rhabdomyolysis Acute Kidney Injury Hypernatremia  -Trend BMP / urinary output -Replace electrolytes as indicated -Avoid nephrotoxic agents, ensure adequate renal perfusion -follow CK   Best Practice (right click and "Reselect all SmartList Selections" daily)  Diet/type: NPO DVT prophylaxis: LMWH GI prophylaxis: PPI Lines: Central line Foley:  Yes, and it is still needed Code Status:  DNR.  Prior discussion was for time limited trial of support.  Will need to revisit with family pending progress.   Aunt x2 updated at bedside in detail 8/24.  Niece 9/24 "Alvy Beal is her HCPOA).  Patient's husband is deceased and she does not have children.   Critical Care Time: 34 minutes    Bradly Bienenstock, MSN, APRN, NP-C, AGACNP-BC [facility] 08/30/2020, 7:20 AM   Please see [email or web address] for pager details.   From 7A-7P if no response, please call [phone] After hours, please call ELink [phone]

## 2020-08-30 NOTE — Progress Notes (Signed)
[facility] ADULT ICU REPLACEMENT PROTOCOL   The patient does apply for the [facility] Adult ICU Electrolyte Replacment Protocol based on the criteria listed below:   1.Exclusion criteria: TCTS patients, ECMO patients and Hypothermia Protocol, and   Dialysis patients 2. Is GFR >/= 30 ml/min? Yes.    Patient's GFR today is >51 3. Is SCr </= 2? Yes.   Patient's SCr is 1.24 mg/dL 4. Did SCr increase >/= 0.5 in 24 hours? No. 5.Pt's weight >40kg  Yes.   6. Abnormal electrolyte(s): K+ 3.4  7. Electrolytes replaced per protocol 8.  Call MD STAT for K+ </= 2.5, Phos </= 1, or Mag </= 1 Physician:  n/a  Samantha [name] 08/30/2020 5:16 AM

## 2020-08-31 DIAGNOSIS — I63412 Cerebral infarction due to embolism of left middle cerebral artery: Secondary | ICD-10-CM

## 2020-08-31 DIAGNOSIS — R739 Hyperglycemia, unspecified: Secondary | ICD-10-CM

## 2020-08-31 LAB — COMPREHENSIVE METABOLIC PANEL
ALT: 26 U/L (ref 0–44)
AST: 61 U/L — ABNORMAL HIGH (ref 15–41)
Albumin: 1.9 g/dL — ABNORMAL LOW (ref 3.5–5.0)
Alkaline Phosphatase: 80 U/L (ref 38–126)
Anion gap: 6 (ref 5–15)
BUN: 34 mg/dL — ABNORMAL HIGH (ref 6–20)
CO2: 25 mmol/L (ref 22–32)
Calcium: 9.3 mg/dL (ref 8.9–10.3)
Chloride: 130 mmol/L — ABNORMAL HIGH (ref 98–111)
Creatinine, Ser: 1.02 mg/dL — ABNORMAL HIGH (ref 0.44–1.00)
GFR, Estimated: >60 mL/min (ref >60)
Glucose, Bld: 124 mg/dL — ABNORMAL HIGH (ref 70–99)
Potassium: 3.3 mmol/L — ABNORMAL LOW (ref 3.5–5.1)
Sodium: 161 mmol/L (ref 135–145)
Total Bilirubin: 0.4 mg/dL (ref 0.3–1.2)
Total Protein: 6.3 g/dL — ABNORMAL LOW (ref 6.5–8.1)

## 2020-08-31 LAB — CBC
HCT: 38.7 % (ref 36.0–46.0)
Hemoglobin: 11.7 g/dL — ABNORMAL LOW (ref 12.0–15.0)
MCH: 27.5 pg (ref 26.0–34.0)
MCHC: 30.2 g/dL (ref 30.0–36.0)
MCV: 91.1 fL (ref 80.0–100.0)
Platelets: 200 10*3/uL (ref 150–400)
RBC: 4.25 MIL/uL (ref 3.87–5.11)
RDW: 15.5 % (ref 11.5–15.5)
WBC: 13.4 10*3/uL — ABNORMAL HIGH (ref 4.0–10.5)
nRBC: 0 % (ref 0.0–0.2)

## 2020-08-31 LAB — GLUCOSE, CAPILLARY
Glucose-Capillary: 113 mg/dL — ABNORMAL HIGH (ref 70–99)
Glucose-Capillary: 135 mg/dL — ABNORMAL HIGH (ref 70–99)
Glucose-Capillary: 135 mg/dL — ABNORMAL HIGH (ref 70–99)
Glucose-Capillary: 136 mg/dL — ABNORMAL HIGH (ref 70–99)
Glucose-Capillary: 157 mg/dL — ABNORMAL HIGH (ref 70–99)
Glucose-Capillary: 162 mg/dL — ABNORMAL HIGH (ref 70–99)

## 2020-08-31 LAB — SODIUM
Sodium: 158 mmol/L — ABNORMAL HIGH (ref 135–145)
Sodium: 159 mmol/L — ABNORMAL HIGH (ref 135–145)
Sodium: 160 mmol/L — ABNORMAL HIGH (ref 135–145)

## 2020-08-31 MED ORDER — POTASSIUM CHLORIDE 20 MEQ PO PACK
20.0000 meq | PACK | ORAL | Status: AC
  Administered 2020-08-31 (×2): 20 meq
  Filled 2020-08-31 (×2): qty 1

## 2020-08-31 MED ORDER — FREE WATER
100.0000 mL | Status: AC
  Administered 2020-08-31 (×4): 100 mL

## 2020-08-31 MED ORDER — POTASSIUM CHLORIDE 10 MEQ/50ML IV SOLN
10.0000 meq | INTRAVENOUS | Status: AC
  Administered 2020-08-31 (×4): 10 meq via INTRAVENOUS
  Filled 2020-08-31 (×4): qty 50

## 2020-08-31 NOTE — Progress Notes (Signed)
eLink Physician-Brief Progress Note Patient Name: Samantha [name] DOB: [birth year] MRN: [id number]   Date of Service  08/31/2020  HPI/Events of Note  Patient has had straight catheterization x 3 over the past 24 hours, bladder scan shoe > 400 ml urinary retention despite patient being without urine output.  eICU Interventions  Foley catheter ordered.        Thomasene Lot Lekeith Wulf 08/31/2020, 4:17 AM

## 2020-08-31 NOTE — Progress Notes (Addendum)
STROKE TEAM PROGRESS NOTE   Interval History  No family is at the bedside.  No acute events overnight pt still in respiratory failure and intubated on vent, no significant neuro changes.. CT head yesterday showed large left hemispheric infarct involving entire left MCA territory with slight petechial hemorrhage but no hemorrhagic transformation and left-to-right midline shift of 1.2 cm.  Na 160.   Pertinent Lab Work and Imaging    08/14/2020 CT Head WO IV Contrast Acute left MCA territory infarct without hemorrhagic conversion or progressive swelling. Midline shift measures 4 mm.  CT HEAD WITHOUT CONTRAST 08/30/20 IMPRESSION: 1. Large left hemisphere infarct involving the entire Left MCA territory. Petechial hemorrhage but no malignant hemorrhagic transformation. 2. Increased intracranial mass effect since 08/15/2020, now with 12 mm rightward midline shift and trapping of the right temporal horn-stable from the MRI at 0228 hours today. 3. Previous right Pcomm region aneurysm coil embolization.  MRI HEAD  08/30/20 IMPRESSION: 1. Large evolving early subacute ischemic left MCA distribution infarct involving the entirety of the left MCA territory. Associated petechial hemorrhage throughout the area of infarction. Additionally, superimposed heterogeneous T1 hyperintensity at the level of the left basal ganglia raises the possibility for hemorrhagic transformation. Correlation with dedicated head CT recommended. 2. Marked interval worsening in left-to-right midline shift now measuring up to 13 mm. 3. Few additional punctate foci of acute to early subacute ischemia involving the subcortical right parietal lobe, right basal ganglia, and ventral right midbrain as above.   MRA HEAD  08/30/20 IMPRESSION: 1. Occlusion of the left M1 segment at its origin. No discernible flow distally within the left MCA distribution. 2. Moderate atheromatous change elsewhere throughout the intracranial  circulation. Associated severe multifocal stenoses about the carotid siphons, with additional moderate left P2 stenosis as above. 3. 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 Probable small 2 mm remnant at the base of the coil pack.  EEG 08/30/20 ABNORMALITY - Continuous slow, generalized and lateralized left hemisphere -Periodic discharges with triphasic morphology, generalized and maximal right frontal IMPRESSION: This study showed periodic discharges with triphasic morphology at 1-1.5 Hz which is on the ictal-interictal continuum with low potential for seizures. This EEG pattern can also be seen with toxic -metabolic causes.  Additionally there is cortical dysfunction in left hemisphere likely secondary to underlying structural abnormality/stroke. There is also moderate to severe diffuse encephalopathy, nonspecific etiology.  No seizures were seen throughout the recording.  Physical Examination   Temp:  [97.9 F (36.6 C)-98.8 F (37.1 C)] 98.8 F (37.1 C) (08/25 0734) Pulse Rate:  [60-84] 72 (08/25 0747) Resp:  [18-27] 21 (08/25 0747) BP: (117-196)/(46-81) 190/81 (08/25 0935) SpO2:  [97 %-99 %] 97 % (08/25 0747) FiO2 (%):  [40 %] 40 % (08/25 0747) Weight:  [72.3 kg] 72.3 kg (08/25 0600)  General - Well nourished, well developed, intubated not on sedation.  Ophthalmologic - fundi not visualized due to noncooperation.  Cardiovascular - Regular rate and rhythm.  Neuro - intubated not on sedation, eyes closed, not following commands. With forced eye opening, eyes in mid position, not blinking to visual threat, doll's eyes present, not tracking, pupil equal size 4 mm, reactive to light. Corneal reflex present on the left but weakly present on the right, gag and cough present. Breathing over the vent.    Tongue protrusion not cooperative. On pain stimulation, withdraw left UE, able to against gravity, withdraw LLE 2/5, but no movement of right upper and lower  extremity. DTR 1+ and  no babinski. Sensation, coordination and gait not tested.  Assessment and Plan   Samantha [name] is a 60 y.o. 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   Stroke - L MCA large infarct, embolic pattern, source unclear, concerning for multiple uncontrolled stroke risk factors.  However, cardioembolic source cannot be ruled out at this time.  CT head 8/21 left large MCA infarct with MLS 38mm Repeat CT head stable infarct and MLS 12mm MRI Head 08/30/20 - Marked interval worsening in left-to-right midline shift now measuring up to 13 mm. Large left MCA infarct with petechial HT  MRA Head 08/30/20 - Occlusion of the left M1 segment at its origin. No discernible flow distally within the left MCA distribution.  CT Head 08/30/20 - 12 mm rightward midline shift and trapping of the right temporal.   CUS left ICA unremarkable BLE doppler no DVT Echo EF 65-70% LDL 158 hemoglobin a1c 12.1  DVT prophylaxis - heparin subq Not on antithrombotics PTA, now on ASA 325. OK to continue Prognosis poor - palliative care on board, family would like to see any improvement for the next a couple of days.  Cerebral edema CT x 2 stable MLS 73mm MRI 8/24 severe cerebral edema with MLS 1.3 cm Was on 3% saline, now off given Na at goal on TF Na 160 Not candidate for hemicrani Poor prognosis, palliative care on board  Seizure  Presented with right face and arm twitching Loaded with keppra LTM EEG 8/22 - Continuous slow, generalized and lateralized left hemisphere LTM EEG 8/23 - periodic discharges with triphasic morphology with low potential for seizures.  No seizures seen throughout the recording.  Off LTM EEG Continue keppra 500mg  bid -> changed to po  Respiratory failure Intubated on vent CCM on board Off propofol will need Trach  if decided on aggressive care  Pseudohyponatremia, resolved Due to sever hyperglycemia Na level arise after correction of hypoglycemia Off 3% saline -> NS @ 75-> off Na 141-146-152-157-159 -160 -160->161 - will order free water through G tube - 100cc's Q4 hours x 4 and check Na Q4 hours x 4  Uncontrolled DM HHS CCM on board A1C 12.1 Insulin drip -> levemir  SSI CBG monitoring CCM on board  Hypertension Home meds - lisinopril-hctz, clonidine patch at home.  Stable now BP goal < 180/105 Labetalol as needed LTM BP goal normotensive  Hyperlipidemia Not on statin PTA LDL goal is < 70. LDL is 158 On Atorvastatin 80 mg Continue statin on discharge  Rhabdomyolysis Dehydration AKI Cre 2.15->1.86->1.33->1.68->1.35->1.24->1.02 On TF CCM on board BMP monitoring  Bacteremia Sepsis  Blood Cx - Klebsiella pneumoniae Ancef started 08/29/20 T Max 100.8->100.4->afebrile 98.8 WBC's - 20.9->15.4->13.9->13.4  Paroxysmal SVT CCM following  Palliative Care Consult DNR Time trial of treatments for 72 hours, if not improvements are made further conversations on comfort measures will take place Ongoing PMT support   Hospital day # 4  Patient continues to be critically ill and unresponsive with poor neurological exam and in respiratory failure requiring ventilatory support.  Prognosis of surviving this making meaningful recovery is quite slim.  Family has had discussions and would like to support for a few more days and have indicated that he would not want prolonged ventilator support trach PEG and nursing home placement.  Discussed with Dr. 08/31/20  critical care medicine. This patient is critically ill and at significant risk of neurological worsening, death and care requires constant monitoring of vital signs, hemodynamics,respiratory and cardiac monitoring, extensive review of multiple databases, frequent neurological assessment, discussion with family, other specialists and medical  decision making of high complexity.I have made any additions or clarifications directly to the above note.This critical care time does not reflect procedure time, or teaching time or supervisory time of PA/NP/Med Resident etc but could involve care discussion time.  I spent 30 minutes of neurocritical care time  in the care of  this patient.  Delia Heady, MD     To contact Stroke Continuity provider, please refer to [email or web address]. After hours, contact General Neurology

## 2020-08-31 NOTE — Progress Notes (Signed)
eLink Physician-Brief Progress Note Patient Name: Samantha [name] DOB: 11-Oct-1960 MRN: [id number]   Date of Service  08/31/2020  HPI/Events of Note  Patient with ICH and coma.  eICU Interventions  Serum sodium 161 per neurology goal.        [name] 08/31/2020, 5:50 AM

## 2020-08-31 NOTE — Progress Notes (Signed)
[facility] ADULT ICU REPLACEMENT PROTOCOL   The patient does apply for the [facility] Adult ICU Electrolyte Replacment Protocol based on the criteria listed below:   1.Exclusion criteria: TCTS patients, ECMO patients and Hypothermia Protocol, and   Dialysis patients 2. Is GFR >/= 30 ml/min? Yes.    Patient's GFR today is >60 3. Is SCr </= 2? Yes.   Patient's SCr is  1.02 mg/dL 4. Did SCr increase >/= 0.5 in 24 hours? No. 5.Pt's weight >40kg  Yes.   6. Abnormal electrolyte(s): K+ 3.3  7. Electrolytes replaced per protocol 8.  Call MD STAT for K+ </= 2.5, Phos </= 1, or Mag </= 1 Physician:  n/a   Samantha [name] 08/31/2020 5:28 AM

## 2020-08-31 NOTE — Progress Notes (Addendum)
NAME:  Marranda Arakelian, MRN:  [id number], DOB:  [birth year], LOS: 4 ADMISSION DATE:  08/16/2020, CONSULTATION DATE:  09/01/2020 REFERRING MD: Dr. Daun Peacock, CHIEF COMPLAINT:  ALOC   History of Present Illness:  60 year old F that presented to the ER on 8/21 after being found down at home.  Family had not heard from the patient in 2 days. They went to the home and saw her on the bathroom floor through the window.  She was in respiratory distress, intubated on arrival to ER.  It was noted that her right arm and leg were completely flaccid prior to intubation.  Patient has a long history of hypertension. Initial workup significant for large L MCA territory infarct, likely subacute, without hemorrhage.  CT C-spine without acute findings.  Labs showed an initial glucose of >600, creatinine 2.15 (baseline <1), WBC 19k, CK 1130.  Pertinent  Medical History  Hypertension Cerebral aneurysm Diabetes mellitus type 2 Hyperlipidemia  Significant Hospital Events: Including procedures, antibiotic start and stop dates in addition to other pertinent events   8/21 Brought in from home, intubated, admit to ICU, R IJ CVC placed 8/21 Initial head CT with large L MCA infarct, likely subacute. ECHO with LVEF 65-70%, no RWMA, severe LVH, grade I diastolic dysfunction  8/22 EEG w/o seizure overnight. Off propofol since 10am.  8/23 Abx narrowed to cefazolin.  MRI/MRA Brain with large evolving early subacute ischemic L MCA distribution infarct involving the entirety of the left MCA territory, associated petechial hemorrhage throughout the area of infarction, possible hemorrhagic transformation in the left basal ganglia, marked worsening of left-to-right midline shift measuring 64mm. 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  EEG showed periodic discharges with triphasic morphology with low potential for seizures  Subjective   No acute events  overnight.   Objective   Blood pressure (!) 165/65, pulse 72, temperature 98.8 F (37.1 C), temperature source Oral, resp. rate (!) 21, height 5\' 1"  (1.549 m), weight 72.3 kg, SpO2 97 %.    Vent Mode: PSV;CPAP FiO2 (%):  [40 %] 40 % Set Rate:  [18 bmp] 18 bmp Vt Set:  [400 mL] 400 mL PEEP:  [5 cmH20] 5 cmH20 Pressure Support:  [12 cmH20] 12 cmH20 Plateau Pressure:  [15 cmH20-16 cmH20] 16 cmH20   Intake/Output Summary (Last 24 hours) at 08/31/2020 0756 Last data filed at 08/31/2020 0500 Gross per 24 hour  Intake 617.28 ml  Output 1380 ml  Net -762.72 ml    Filed Weights   08/29/20 0500 08/30/20 0500 08/31/20 0600  Weight: 87 kg 87.5 kg 72.3 kg   Exam:  General:  adult female in bed, NAD Neuro:  No verbal response. Moves L side to pain. Plegic on right. Leftward gaze pref. HEENT:  [place]/AT, No JVD noted, PERRL Cardiovascular:  RRR, no MRG Lungs:  Clear bilateral breath sounds Abdomen:  Soft, non-distended, non-tender.  Musculoskeletal:  No acute deformity Skin:  Intact, MMM   Assessment & Plan:   Left MCA CVA Hx PCOM Aneurysmal [facility] 2005 s/p Endovascular Repair Found down at home, LKW 2 days prior to admit.  Noted PSVT while in room with patient 8/23, ? AF as source. MRI/MRA on 8/23 with worsening findings > 34mm shift, suspected hemorrhagic conversion in areas.   -appreciate Neurology  -follow Na+ trend, note s/p 3% -statin -SBP goal <180 -norvasc 5mg  QD, PRN hydralazine -continue keppra -seizure precautions -Constellation of findings worrisome for  grim prognosis.  -Palliative involved. Family would like a few days to gauge progress.   Acute Respiratory Failure secondary to L MCA CVA -Full vent support -wean PEEP / FiO2 for sats >90% -daily SBT but mental status barrier to extubation  -VAP prevention measures   Klebsiella Bacteremia  Fever Unclear source of bacteremia, suspect secondary to possible aspiration  -continue cefazolin, D5 abx  -follow fever curve /  WBC trend  -Repeat BC to ensure cleared  ?AF/PSVT  Noted on exam 8/23 -tele monitoring  HHS -SSI, standard scale  -glucose goal 140-180 -continue levemir 15 units BID   Mild Rhabdomyolysis Acute Kidney Injury Hypernatremia  -Trend BMP / urinary output -Replace electrolytes as indicated -follow CK > trending down  Best Practice (right click and "Reselect all SmartList Selections" daily)  Diet/type: NPO DVT prophylaxis: LMWH GI prophylaxis: PPI Lines: Central line Foley:  Yes, and it is still needed Code Status:  DNR.  Prior discussion was for time limited trial of support.  Will need to revisit with family pending progress.    Niece Alvy Beal "Bradly Bienenstock is her HCPOA).  Patient's husband is deceased and she does not have children.   Critical Care Time: 38 minutes     Joneen Roach, AGACNP-BC [facility]  See Amion for personal pager PCCM on call pager [phone] until 7pm. Please call Elink 7p-7a. [phone]  08/31/2020 7:58 AM

## 2020-09-01 DIAGNOSIS — R739 Hyperglycemia, unspecified: Secondary | ICD-10-CM

## 2020-09-01 LAB — BASIC METABOLIC PANEL
Anion gap: 8 (ref 5–15)
Anion gap: 7 (ref 5–15)
BUN: 35 mg/dL — ABNORMAL HIGH (ref 6–20)
BUN: 29 mg/dL — ABNORMAL HIGH (ref 6–20)
CO2: 26 mmol/L (ref 22–32)
CO2: 25 mmol/L (ref 22–32)
Calcium: 8.9 mg/dL (ref 8.9–10.3)
Calcium: 8.7 mg/dL — ABNORMAL LOW (ref 8.9–10.3)
Chloride: 127 mmol/L — ABNORMAL HIGH (ref 98–111)
Chloride: 125 mmol/L — ABNORMAL HIGH (ref 98–111)
Creatinine, Ser: 1.06 mg/dL — ABNORMAL HIGH (ref 0.44–1.00)
Creatinine, Ser: 1.04 mg/dL — ABNORMAL HIGH (ref 0.44–1.00)
GFR, Estimated: >60 mL/min (ref >60)
GFR, Estimated: >60 mL/min (ref >60)
Glucose, Bld: 113 mg/dL — ABNORMAL HIGH (ref 70–99)
Glucose, Bld: 249 mg/dL — ABNORMAL HIGH (ref 70–99)
Potassium: 3.7 mmol/L (ref 3.5–5.1)
Potassium: 3.6 mmol/L (ref 3.5–5.1)
Sodium: 161 mmol/L (ref 135–145)
Sodium: 157 mmol/L — ABNORMAL HIGH (ref 135–145)

## 2020-09-01 LAB — GLUCOSE, CAPILLARY
Glucose-Capillary: 109 mg/dL — ABNORMAL HIGH (ref 70–99)
Glucose-Capillary: 158 mg/dL — ABNORMAL HIGH (ref 70–99)
Glucose-Capillary: 203 mg/dL — ABNORMAL HIGH (ref 70–99)
Glucose-Capillary: 184 mg/dL — ABNORMAL HIGH (ref 70–99)
Glucose-Capillary: 127 mg/dL — ABNORMAL HIGH (ref 70–99)
Glucose-Capillary: 70 mg/dL (ref 70–99)

## 2020-09-01 LAB — CBC
HCT: 36.9 % (ref 36.0–46.0)
Hemoglobin: 10.9 g/dL — ABNORMAL LOW (ref 12.0–15.0)
MCH: 27.3 pg (ref 26.0–34.0)
MCHC: 29.5 g/dL — ABNORMAL LOW (ref 30.0–36.0)
MCV: 92.3 fL (ref 80.0–100.0)
Platelets: 183 10*3/uL (ref 150–400)
RBC: 4 MIL/uL (ref 3.87–5.11)
RDW: 15.6 % — ABNORMAL HIGH (ref 11.5–15.5)
WBC: 11.1 10*3/uL — ABNORMAL HIGH (ref 4.0–10.5)
nRBC: 0 % (ref 0.0–0.2)

## 2020-09-01 LAB — MAGNESIUM: Magnesium: 3.1 mg/dL — ABNORMAL HIGH (ref 1.7–2.4)

## 2020-09-01 MED ORDER — DEXTROSE 50 % IV SOLN
INTRAVENOUS | Status: AC
  Filled 2020-09-01: qty 50

## 2020-09-01 MED ORDER — ALBUTEROL SULFATE (2.5 MG/3ML) 0.083% IN NEBU: 2.5000 mg | INHALATION_SOLUTION | RESPIRATORY_TRACT | Status: DC | PRN

## 2020-09-01 MED ORDER — DEXTROSE 5 % IV SOLN: INTRAVENOUS | Status: AC

## 2020-09-01 MED ORDER — ALBUMIN HUMAN 5 % IV SOLN
12.5000 g | Freq: Once | INTRAVENOUS | Status: AC
  Administered 2020-09-01: 12.5 g via INTRAVENOUS
  Filled 2020-09-01: qty 250

## 2020-09-01 MED ORDER — FREE WATER
150.0000 mL | Status: AC
  Administered 2020-09-01 (×6): 150 mL

## 2020-09-01 NOTE — Progress Notes (Signed)
eLink Physician-Brief Progress Note Patient Name: Samantha [name] DOB: 1960/12/22 MRN: [id number]   Date of Service  09/01/2020  HPI/Events of Note  Patient with soft blood pressures (85/54), MAP 64, serum albumin 1.9.  eICU Interventions  Albumin 5 % 250 ml iv bolus x 1 ordered.        Migdalia Dk 09/01/2020, 8:36 PM

## 2020-09-01 NOTE — Progress Notes (Signed)
STROKE TEAM PROGRESS NOTE   Interval History  No family is at the bedside.  No acute events overnight pt still in respiratory failure and intubated on vent, no significant neuro changes..  Palliative care nurse practitioner has spoken to patient's niece and nephew and family is leaning to comfort care and withdrawal of care tomorrow.  Patient neurological exam has worsened with now nonreactive pupils and absent corneal reflexes but she has preserved gag and some respiratory effort  Pertinent Lab Work and Imaging    08/26/2020 CT Head WO IV Contrast Acute left MCA territory infarct without hemorrhagic conversion or progressive swelling. Midline shift measures 4 mm.  CT HEAD WITHOUT CONTRAST 08/30/20 IMPRESSION: 1. Large left hemisphere infarct involving the entire Left MCA territory. Petechial hemorrhage but no malignant hemorrhagic transformation. 2. Increased intracranial mass effect since 08/23/2020, now with 12 mm rightward midline shift and trapping of the right temporal horn-stable from the MRI at 0228 hours today. 3. Previous right Pcomm region aneurysm coil embolization.  MRI HEAD  08/30/20 IMPRESSION: 1. Large evolving early subacute ischemic left MCA distribution infarct involving the entirety of the left MCA territory. Associated petechial hemorrhage throughout the area of infarction. Additionally, superimposed heterogeneous T1 hyperintensity at the level of the left basal ganglia raises the possibility for hemorrhagic transformation. Correlation with dedicated head CT recommended. 2. Marked interval worsening in left-to-right midline shift now measuring up to 13 mm. 3. Few additional punctate foci of acute to early subacute ischemia involving the subcortical right parietal lobe, right basal ganglia, and ventral right midbrain as above.   MRA HEAD  08/30/20 IMPRESSION: 1. Occlusion of the left M1 segment at its origin. No discernible flow distally within the left MCA  distribution. 2. Moderate atheromatous change elsewhere throughout the intracranial circulation. Associated severe multifocal stenoses about the carotid siphons, with additional moderate left P2 stenosis as above. 3. 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 Probable small 2 mm remnant at the base of the coil pack.  EEG 08/30/20 ABNORMALITY - Continuous slow, generalized and lateralized left hemisphere -Periodic discharges with triphasic morphology, generalized and maximal right frontal IMPRESSION: This study showed periodic discharges with triphasic morphology at 1-1.5 Hz which is on the ictal-interictal continuum with low potential for seizures. This EEG pattern can also be seen with toxic -metabolic causes.  Additionally there is cortical dysfunction in left hemisphere likely secondary to underlying structural abnormality/stroke. There is also moderate to severe diffuse encephalopathy, nonspecific etiology.  No seizures were seen throughout the recording.  Physical Examination   Temp:  [97.5 F (36.4 C)-99.3 F (37.4 C)] 97.5 F (36.4 C) (08/26 1117) Pulse Rate:  [56-94] 75 (08/26 1230) Resp:  [18-25] 21 (08/26 1200) BP: (132-212)/(52-80) 194/68 (08/26 1208) SpO2:  [95 %-99 %] 97 % (08/26 1200) FiO2 (%):  [40 %] 40 % (08/26 1200) Weight:  [71.8 kg] 71.8 kg (08/26 0600)  General - Well nourished, well developed, intubated not on sedation.  Ophthalmologic - fundi not visualized due to noncooperation.  Cardiovascular - Regular rate and rhythm.  Neuro - intubated not on sedation, eyes closed, not following commands. With forced eye opening, eyes in mid position, not blinking to visual threat, doll's eyes present, not tracking, pupil equal size mm, not reactive to light. Corneal reflex absent bilaterally, gag and cough present. Breathing over the vent.    Tongue protrusion not cooperative. On pain stimulation, withdraw left UE, able to against gravity, withdraw LLE  2/5, but no movement of right  upper and lower extremity. DTR 1+ and no babinski. Sensation, coordination and gait not tested.  Assessment and Plan   Samantha [name] is a 60 y.o. 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   Stroke - L MCA large infarct, embolic pattern, source unclear, concerning for multiple uncontrolled stroke risk factors.  However, cardioembolic source cannot be ruled out at this time.  CT head 8/21 left large MCA infarct with MLS 61mm Repeat CT head stable infarct and MLS 39mm MRI Head 08/30/20 - Marked interval worsening in left-to-right midline shift now measuring up to 13 mm. Large left MCA infarct with petechial HT  MRA Head 08/30/20 - Occlusion of the left M1 segment at its origin. No discernible flow distally within the left MCA distribution.  CT Head 08/30/20 - 12 mm rightward midline shift and trapping of the right temporal.   CUS left ICA unremarkable BLE doppler no DVT Echo EF 65-70% LDL 158 hemoglobin a1c 12.1  DVT prophylaxis - heparin subq Not on antithrombotics PTA, now on ASA 325. OK to continue Prognosis poor - palliative care on board, family would like to see any improvement for the next a couple of days.  Cerebral edema CT x 2 stable MLS 39mm MRI 8/24 severe cerebral edema with MLS 1.3 cm Was on 3% saline, now off given Na at goal on TF Na 160 Not candidate for hemicrani Poor prognosis, palliative care on board  Seizure  Presented with right face and arm twitching Loaded with keppra LTM EEG 8/22 - Continuous slow, generalized and lateralized left hemisphere LTM EEG 8/23 - periodic discharges with triphasic morphology with low potential for seizures.  No seizures seen throughout the recording.  Off LTM EEG Continue keppra 500mg  bid -> changed to po  Respiratory failure Intubated on  vent CCM on board Off propofol will need Trach if decided on aggressive care  Pseudohyponatremia, resolved Due to sever hyperglycemia Na level arise after correction of hypoglycemia Off 3% saline -> NS @ 75-> off Na 141-146-152-157-159 -160 -160->161 - will order free water through G tube - 100cc's Q4 hours x 4 and check Na Q4 hours x 4  Uncontrolled DM HHS CCM on board A1C 12.1 Insulin drip -> levemir  SSI CBG monitoring CCM on board  Hypertension Home meds - lisinopril-hctz, clonidine patch at home.  Stable now BP goal < 180/105 Labetalol as needed LTM BP goal normotensive  Hyperlipidemia Not on statin PTA LDL goal is < 70. LDL is 158 On Atorvastatin 80 mg Continue statin on discharge  Rhabdomyolysis Dehydration AKI Cre 2.15->1.86->1.33->1.68->1.35->1.24->1.02 On TF CCM on board BMP monitoring  Bacteremia Sepsis  Blood Cx - Klebsiella pneumoniae Ancef started 08/29/20 T Max 100.8->100.4->afebrile 98.8 WBC's - 20.9->15.4->13.9->13.4  Paroxysmal SVT CCM following  Palliative Care Consult DNR Time trial of treatments for 72 hours, if not improvements are made further conversations on comfort measures will take place Ongoing PMT support   Hospital day # 5  Patient continues to be critically ill and unresponsive with poor and now worsening neurological exam and in respiratory failure requiring ventilatory support.  Prognosis of surviving this making meaningful recovery is quite slim.  Family has had discussions with palliative care nurse practitioner and are leaning towards comfort care and withdrawal of care tomorrow  and have indicated that family  would not want prolonged ventilator support ,trach ,PEG and nursing home placement.  Discussed with Dr. Denese Killings critical care medicine. This patient is critically ill and at significant risk of neurological worsening, death and care requires constant monitoring of vital signs, hemodynamics,respiratory and cardiac  monitoring, extensive review of multiple databases, frequent neurological assessment, discussion with family, other specialists and medical decision making of high complexity.I have made any additions or clarifications directly to the above note.This critical care time does not reflect procedure time, or teaching time or supervisory time of PA/NP/Med Resident etc but could involve care discussion time.  I spent 30 minutes of neurocritical care time  in the care of  this patient.  Delia Heady, MD     To contact Stroke Continuity provider, please refer to [email or web address]. After hours, contact General Neurology

## 2020-09-01 NOTE — Progress Notes (Signed)
eLink Physician-Brief Progress Note Patient Name: Danae Oland DOB: 1960/06/25 MRN: [id number]   Date of Service  09/01/2020  HPI/Events of Note  Serum Sodium is still 160 and ordered free water boluses via NG tube have completed.  eICU Interventions  Free water 150 ml Q 4 hours x 24 hours ordered.        Thomasene Lot Youcef Klas 09/01/2020, 2:35 AM

## 2020-09-01 NOTE — Progress Notes (Signed)
eLink Physician-Brief Progress Note Patient Name: Samantha [name] DOB: 1960/08/23 MRN: [id number]   Date of Service  09/01/2020  HPI/Events of Note  Serum Sodium 161.  eICU Interventions  D 5 % water gtt at 100 ml / hour x 5 hours ordered.        Thomasene Lot Angelamarie Avakian 09/01/2020, 5:45 AM

## 2020-09-01 NOTE — Progress Notes (Signed)
Palliative Medicine Inpatient Follow Up Note   Consulting Provider: Gleason, Otilio Carpen, PA-C   Reason for consult:   [place] Palliative Medicine Consult  Reason for Consult? goals of care discussion following large stroke    HPI:  Per intake H&P --> 60 year old black female that presented to the emergency room from home.  The patient was found down in her bathroom.  She is unconscious unresponsive.  Family had not heard from her for 2 days therefore they came to the house and saw her on the floor through the bathroom window.  911/EMS was called.  Patient was found to have significant respiratory distress not able to protect her airway therefore was intubated on arrival to the emergency room.  It was noted that her right arm and leg were completely flaccid.  Prior to intubation.  Patient has a long history of hypertension.    Palliative care has been requested to aide in further goals of care conversations in the setting of a large left MCA infarction.   Today's Discussion (09/01/2020):  *Please note that this is a verbal dictation therefore any spelling or grammatical errors are due to the "[place] [employer]" system interpretation.  I met with patients nephew, [name] and called his niece, [name] on speaker-phone to further discuss diagnosis prognosis in the setting of a L MCA stroke with marked interval worsening in left-to-right midline shift now measuring up to 13 mm. Large left MCA infarct with petechial HT. We reviewed that throughout the last few days her condition has unfortunately not improved. Patients niece [name] was very understanding of this news. She shares with me that she does not wish to prolong the inevitable as her aunt would never want to suffer in such a way. We reviewed the importance of involving family in these decisions and allowing them time to spend with [place].   [name] endorses that she will speak to family today. She states that she would  like to pursue making [place] comfortable tomorrow at 15:30. She will allow all family who would like to be present the opportunity to do so.   We reviewed in  brief what comfort measures are. We talked about transition to comfort measures in house and what that would entail inclusive of medications to control pain, dyspnea, agitation, nausea, itching, and hiccups.  We discussed stopping all uneccessary measures such as mechanical ventilation, telemetry, artificial nutrition, blood draws, needle sticks, and frequent vital signs.    [name] understood, provided time and space for she and [name] to express their feelings.   Questions and concerns addressed   Objective Assessment: Vital Signs Vitals:   09/01/20 0600 09/01/20 0745  BP: (!) 155/61   Pulse: 69 61  Resp: 18 18  Temp:    SpO2: 98% 98%    Intake/Output Summary (Last 24 hours) at 09/01/2020 0750 Last data filed at 09/01/2020 4268 Gross per 24 hour  Intake 1156 ml  Output 830 ml  Net 326 ml   Last Weight  Most recent update: 09/01/2020  6:20 AM    Weight  71.8 kg (158 lb 4.6 oz)            Gen:  Middle aged [place] F very ill appearing HEENT: ETT and NGT, dry mucous membranes CV: Regular rate and irregular rhythm  PULM: Mechanical ventilator ABD: soft/nontender  EXT: (+) generalized edema Neuro: Obtunded  SUMMARY OF RECOMMENDATIONS   DNAR   Plan for compassionate extubation tomorrow at 15:30 with a change  in care emphasis to comfort measures   Ongoing PMT support  Time Spent:  45 Greater than 50% of the time was spent in counseling and coordination of care ______________________________________________________________________________________ [place] Team Team Cell Phone: [phone] Please utilize secure chat with additional questions, if there is no response within 30 minutes please call the above phone number  Palliative Medicine Team providers are available by phone  from 7am to 7pm daily and can be reached through the team cell phone.  Should this patient require assistance outside of these hours, please call the patient's attending physician.

## 2020-09-01 NOTE — Progress Notes (Signed)
NAME:  Samantha [name], MRN:  [id number], DOB:  [birth year], LOS: 5 ADMISSION DATE:  08/16/2020, CONSULTATION DATE:  08/31/2020 REFERRING MD: Dr. Daun Peacock, CHIEF COMPLAINT:  ALOC   History of Present Illness:  60 year old F that presented to the ER on 8/21 after being found down at home.  Family had not heard from the patient in 2 days. They went to the home and saw her on the bathroom floor through the window.  She was in respiratory distress, intubated on arrival to ER.  It was noted that her right arm and leg were completely flaccid prior to intubation.  Patient has a long history of hypertension. Initial workup significant for large L MCA territory infarct, likely subacute, without hemorrhage.  CT C-spine without acute findings.  Labs showed an initial glucose of >600, creatinine 2.15 (baseline <1), WBC 19k, CK 1130.  Pertinent  Medical History  Hypertension Cerebral aneurysm Diabetes mellitus type 2 Hyperlipidemia  Significant Hospital Events: Including procedures, antibiotic start and stop dates in addition to other pertinent events   8/21 Brought in from home, intubated, admit to ICU, R IJ CVC placed 8/21 Initial head CT with large L MCA infarct, likely subacute. ECHO with LVEF 65-70%, no RWMA, severe LVH, grade I diastolic dysfunction  8/22 EEG w/o seizure overnight. Off propofol since 10am.  8/23 Abx narrowed to cefazolin.  MRI/MRA Brain with large evolving early subacute ischemic L MCA distribution infarct involving the entirety of the left MCA territory, associated petechial hemorrhage throughout the area of infarction, possible hemorrhagic transformation in the left basal ganglia, marked worsening of left-to-right midline shift measuring 4mm. 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  EEG showed periodic discharges with triphasic morphology with low potential for seizures  Subjective   Started on D5  time limited for ongoing hypernatremia   Objective   Blood pressure (!) 155/61, pulse 61, temperature 97.6 F (36.4 C), temperature source Axillary, resp. rate 18, height 5\' 1"  (1.549 m), weight 71.8 kg, SpO2 98 %.    Vent Mode: PRVC FiO2 (%):  [40 %] 40 % Set Rate:  [18 bmp] 18 bmp Vt Set:  [400 mL] 400 mL PEEP:  [5 cmH20] 5 cmH20 Pressure Support:  [12 cmH20] 12 cmH20 Plateau Pressure:  [16 cmH20-17 cmH20] 17 cmH20   Intake/Output Summary (Last 24 hours) at 09/01/2020 0823 Last data filed at 09/01/2020 09/03/2020 Gross per 24 hour  Intake 651.62 ml  Output 830 ml  Net -178.38 ml    Filed Weights   08/30/20 0500 08/31/20 0600 09/01/20 0600  Weight: 87.5 kg 72.3 kg 71.8 kg   Exam:   General:  adult female on vent Neuro:  No verbal response. Moves L to pain. Plegic on R. 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  Noted PSVT while in room with patient 8/23, ? AF as source. MRI/MRA on 8/23 with worsening findings > 58mm shift, suspected hemorrhagic conversion in areas.   -appreciate Neurology  -iatrogenic hypernatremia now being corrected with free water and d5 -statin -SBP goal <180 -norvasc 5mg  QD, PRN hydralazine -continue keppra -seizure precautions -Constellation  of findings worrisome for grim prognosis.  -Palliative involved, family meeting planned at noon today.   Acute Respiratory Failure secondary to L MCA CVA -Full vent support -wean PEEP / FiO2 for sats >90% -daily SBT but mental status barrier to extubation  -VAP prevention measures   Klebsiella Bacteremia  Fever Unclear source of bacteremia, suspect secondary to possible aspiration  -continue cefazolin, to stop  after 7 days.  -follow fever curve / WBC trend  ?AF/PSVT  Noted on exam 8/23 -tele monitoring  HHS -SSI, standard scale  -glucose goal 140-180 -continue levemir 15 units BID   Mild Rhabdomyolysis Acute Kidney Injury Hypernatremia  -Trend BMP / urinary output -Replace electrolytes as indicated -follow CK > trending down  Best Practice (right click and "Reselect all SmartList Selections" daily)  Diet/type: NPO DVT prophylaxis: LMWH GI prophylaxis: PPI Lines: Central line Foley:  Yes, and it is still needed Code Status:  DNR.  Prior discussion was for time limited trial of support.  Will need to revisit with family pending progress.    Niece Alvy Beal "Bradly Bienenstock is her HCPOA).  Patient's husband is deceased and she does not have children.   Critical Care Time: 36 minutes     Joneen Roach, AGACNP-BC [facility]  See Amion for personal pager PCCM on call pager [phone] until 7pm. Please call Elink 7p-7a. [phone]  09/01/2020 8:23 AM

## 2020-09-02 LAB — CBC
HCT: 36.1 % (ref 36.0–46.0)
Hemoglobin: 10.7 g/dL — ABNORMAL LOW (ref 12.0–15.0)
MCH: 27.6 pg (ref 26.0–34.0)
MCHC: 29.6 g/dL — ABNORMAL LOW (ref 30.0–36.0)
MCV: 93 fL (ref 80.0–100.0)
Platelets: 159 10*3/uL (ref 150–400)
RBC: 3.88 MIL/uL (ref 3.87–5.11)
RDW: 15.6 % — ABNORMAL HIGH (ref 11.5–15.5)
WBC: 9.6 10*3/uL (ref 4.0–10.5)
nRBC: 0 % (ref 0.0–0.2)

## 2020-09-02 LAB — BASIC METABOLIC PANEL
BUN: 36 mg/dL — ABNORMAL HIGH (ref 6–20)
CO2: 25 mmol/L (ref 22–32)
Calcium: 8.9 mg/dL (ref 8.9–10.3)
Chloride: >130 mmol/L (ref 98–111)
Creatinine, Ser: 1.73 mg/dL — ABNORMAL HIGH (ref 0.44–1.00)
GFR, Estimated: 34 mL/min — ABNORMAL LOW (ref >60)
Glucose, Bld: 97 mg/dL (ref 70–99)
Potassium: 3.4 mmol/L — ABNORMAL LOW (ref 3.5–5.1)
Sodium: 164 mmol/L (ref 135–145)

## 2020-09-02 LAB — GLUCOSE, CAPILLARY
Glucose-Capillary: 76 mg/dL (ref 70–99)
Glucose-Capillary: 46 mg/dL — ABNORMAL LOW (ref 70–99)
Glucose-Capillary: 188 mg/dL — ABNORMAL HIGH (ref 70–99)
Glucose-Capillary: 136 mg/dL — ABNORMAL HIGH (ref 70–99)
Glucose-Capillary: 56 mg/dL — ABNORMAL LOW (ref 70–99)
Glucose-Capillary: 54 mg/dL — ABNORMAL LOW (ref 70–99)

## 2020-09-02 LAB — CK: Total CK: 462 U/L — ABNORMAL HIGH (ref 38–234)

## 2020-09-02 LAB — PHOSPHORUS: Phosphorus: 1.9 mg/dL — ABNORMAL LOW (ref 2.5–4.6)

## 2020-09-02 LAB — MAGNESIUM: Magnesium: 3.2 mg/dL — ABNORMAL HIGH (ref 1.7–2.4)

## 2020-09-02 MED ORDER — DEXTROSE 50 % IV SOLN
INTRAVENOUS | Status: AC
  Filled 2020-09-02: qty 50

## 2020-09-02 MED ORDER — FENTANYL BOLUS VIA INFUSION
100.0000 ug | INTRAVENOUS | Status: DC | PRN
  Filled 2020-09-02: qty 100

## 2020-09-02 MED ORDER — HALOPERIDOL LACTATE 2 MG/ML PO CONC
0.5000 mg | ORAL | Status: DC | PRN
  Filled 2020-09-02: qty 0.3

## 2020-09-02 MED ORDER — FENTANYL 2500MCG IN NS 250ML (10MCG/ML) PREMIX INFUSION
0.0000 ug/h | INTRAVENOUS | Status: DC
  Administered 2020-09-02: 50 ug/h via INTRAVENOUS
  Filled 2020-09-02: qty 250

## 2020-09-02 MED ORDER — DEXTROSE 50 % IV SOLN
25.0000 g | INTRAVENOUS | Status: AC
  Administered 2020-09-02: 25 g via INTRAVENOUS

## 2020-09-02 MED ORDER — POLYVINYL ALCOHOL 1.4 % OP SOLN
1.0000 [drp] | Freq: Four times a day (QID) | OPHTHALMIC | Status: DC | PRN
  Filled 2020-09-02: qty 15

## 2020-09-02 MED ORDER — DEXTROSE 50 % IV SOLN
INTRAVENOUS | Status: AC
  Administered 2020-09-02: 12.5 g
  Filled 2020-09-02: qty 50

## 2020-09-02 MED ORDER — ACETAMINOPHEN 325 MG PO TABS: 650.0000 mg | ORAL_TABLET | Freq: Four times a day (QID) | ORAL | Status: DC | PRN

## 2020-09-02 MED ORDER — DEXTROSE 50 % IV SOLN
INTRAVENOUS | Status: AC
  Administered 2020-09-02: 50 mL
  Filled 2020-09-02: qty 50

## 2020-09-02 MED ORDER — ACETAMINOPHEN 650 MG RE SUPP: 650.0000 mg | Freq: Four times a day (QID) | RECTAL | Status: DC | PRN

## 2020-09-02 MED ORDER — HALOPERIDOL LACTATE 5 MG/ML IJ SOLN: 0.5000 mg | INTRAMUSCULAR | Status: DC | PRN

## 2020-09-02 MED ORDER — BIOTENE DRY MOUTH MT LIQD: 15.0000 mL | OROMUCOSAL | Status: DC | PRN

## 2020-09-02 MED ORDER — HALOPERIDOL 0.5 MG PO TABS
0.5000 mg | ORAL_TABLET | ORAL | Status: DC | PRN
  Filled 2020-09-02: qty 1

## 2020-09-07 NOTE — Progress Notes (Signed)
Palliative Medicine Inpatient Follow Up Note  Consulting Provider: Gleason, Otilio Carpen, PA-C   Reason for consult:   Samantha [name] Palliative Medicine Consult  Reason for Consult? goals of care discussion following large stroke    HPI:  Per intake H&P --> 60 year old black female that presented to the emergency room from home.  The patient was found down in her bathroom.  She is unconscious unresponsive.  Family had not heard from her for 2 days therefore they came to the house and saw her on the floor through the bathroom window.  911/EMS was called.  Patient was found to have significant respiratory distress not able to protect her airway therefore was intubated on arrival to the emergency room.  It was noted that her right arm and leg were completely flaccid.  Prior to intubation.  Patient has a long history of hypertension.    Palliative care has been requested to aide in further goals of care conversations in the setting of a large left MCA infarction.   Today's Discussion (Sep 21, 2020):  *Please note that this is a verbal dictation therefore any spelling or grammatical errors are due to the "[place] [employer]" system interpretation.  I met at bedside with Samantha [name] and her niece Samantha [name] who was joined by her nephew Samantha [name]. There were multiple family members present. We reviewed the importance of these moments with [facility]. I was able to answer questions as asked by patients family and guide them on what comfort measures entail.  Patients family lamented on the strong woman that Samantha [name] was. They shared that she would never want to live in a vegetative state. We reviewed that her joy was in music. Gospel music was played on the computer to aid in therapeutic relief.  Time was spend coordinating visits with family and provided education on death and dying.  Patients niece asked for [place] to proceed with extubation which was completed by the RT.   Requested chaplains presence.    Questions and concerns addressed   Objective Assessment: Vital Signs Vitals:   September 21, 2020 1200 09-21-20 1300  BP: (!) 113/94 98/65  Pulse: 69 69  Resp: 18 18  Temp:    SpO2: 91% 96%    Intake/Output Summary (Last 24 hours) at 2020/09/21 1602 Last data filed at 2020-09-21 1400 Gross per 24 hour  Intake 1124 ml  Output 945 ml  Net 179 ml    Last Weight  Most recent update: 09/21/20  5:12 AM    Weight  71.8 kg (158 lb 4.6 oz)            Gen:  Middle aged [name] very ill appearing HEENT: ETT and NGT, dry mucous membranes CV: Regular rate and irregular rhythm  PULM: Mechanical ventilator ABD: soft/nontender  EXT: (+) generalized edema Neuro: Obtunded  SUMMARY OF RECOMMENDATIONS   DNAR    Comfort Care initiated  Compassionate extubation  Unrestricted visitation  Start on Fentanyl gtt additional comfort medications per [facility]  Spiritual support   Ongoing PMT support  Time Spent:  65 Greater than 50% of the time was spent in counseling and coordination of care ______________________________________________________________________________________ [place] Team Team Cell Phone: [phone] Please utilize secure chat with additional questions, if there is no response within 30 minutes please call the above phone number  Palliative Medicine Team providers are available by phone from 7am to 7pm daily and can be reached through the team cell phone.  Should this patient require assistance  outside of these hours, please call the patient's attending physician.

## 2020-09-07 NOTE — Procedures (Signed)
Extubation Procedure Note  Patient Details:   Name: Samantha [name] DOB: [birth year] MRN: [id number]   Airway Documentation:    Vent end date: 09-13-2020 Vent end time: 1614   Evaluation  Patient extubated per comfort care measures. RN and family at bedside.  [name] [name] Sep 13, 2020, 4:15 PM

## 2020-09-07 NOTE — Progress Notes (Signed)
Pt unresponsive family at bedside. They wanted prayer be said before she is taken off life support. The chaplain offered caring and supportive presence and bedside service of commendation. Family grateful for visit.

## 2020-09-07 NOTE — Death Summary Note (Signed)
DEATH SUMMARY   Patient Details  Name: Samantha [name] Minus MRN: [id number] DOB: 29-Jun-1960  Admission/Discharge Information   Admit Date:  February 23, 2020  Date of Death: Date of Death: 09/04/2020  Time of Death: Time of Death: 1631  Length of Stay: 6  Referring Physician: System, Provider Not In   Reason(s) for Hospitalization  cva  Diagnoses  Preliminary cause of death: Anoxic brain injury [facility](HCC) Secondary Diagnoses (including complications and co-morbidities):  Active Problems:   Acute respiratory failure (HCC)   Cerebrovascular accident (CVA) (HCC)   Hypertensive crisis   Renal insufficiency   Traumatic rhabdomyolysis [facility](HCC)   Hyperglycemia   Brief Hospital Course (including significant findings, care, treatment, and services provided and events leading to death)  Samantha [name] Ensey is a 60 y.o. year old female who per H&P presented to the emergency room from home.  The patient was found down in her bathroom.  She is unconscious unresponsive.  Family had not heard from her for 2 days therefore they came to the house and saw her on the floor through the bathroom window.  911/EMS was called.  Patient was found to have significant respiratory distress not able to protect her airway therefore was intubated on arrival to the emergency room.  It was noted that her right arm and leg were completely flaccid.  Prior to intubation.  Patient has a long history of hypertension  Ultimately found to have large L MCA CVA with cytotoxic edema. 3% was started but ultimately shift continued >3313mm. Neurology following had recommended palliative care. Family was updated regularly and after days of no improvement pt was transitioned to comfort care.  Pt was compassionately extubated and expired.    Pertinent Labs and Studies  Significant Diagnostic Studies CT HEAD WO CONTRAST (5MM)  Result Date: 08/30/2020 CLINICAL DATA:  60 year old female with large left MCA territory infarct on 0February 16, 2022 diagnosed  by CT following presentation of found down. EXAM: CT HEAD WITHOUT CONTRAST TECHNIQUE: Contiguous axial images were obtained from the base of the skull through the vertex without intravenous contrast. COMPARISON:  Brain MRI and intracranial MRA 0228 hours today, and earlier. FINDINGS: Brain: Large area of left hemisphere cytotoxic edema involving the entire left MCA territory. There is some left MCA/PCA watershed involvement also including at the left occipital tip. Intracranial mass effect with rightward midline shift has increased from 4 mm on 0February 16, 2022 to 12 mm now (series 2, image 15). But no malignant hemorrhagic transformation is identified. Petechial hemorrhage evident on the MRI earlier today. There is early trapping of the right lateral ventricle temporal horn. Left lateral ventricle now mostly effaced. No uncal herniation at this time. Suprasellar cistern partially effaced but other basilar cisterns remain patent. Stable gray-white matter differentiation in the right hemisphere and posterior fossa. Vascular: Chronic right side distal ICA aneurysm coil pack. Calcified atherosclerosis at the skull base. Skull: Stable, intact. Sinuses/Orbits: Visualized paranasal sinuses and mastoids are stable and well aerated. Other: Disconjugate gaze. Stable orbit and scalp soft tissues. Partially visible endotracheal and oral enteric tubes. IMPRESSION: 1. Large left hemisphere infarct involving the entire Left MCA territory. Petechial hemorrhage but no malignant hemorrhagic transformation. 2. Increased intracranial mass effect since 0February 16, 2022, now with 12 mm rightward midline shift and trapping of the right temporal horn-stable from the MRI at 0228 hours today. 3. Previous right Pcomm region aneurysm coil embolization. Electronically Signed   By: Odessa FlemingH  Hall M.D.   On: 08/30/2020 05:28   CT HEAD WO CONTRAST (5MM)  Result Date: February 23, 2020  CLINICAL DATA:  Stroke follow-up EXAM: CT HEAD WITHOUT CONTRAST TECHNIQUE:  Contiguous axial images were obtained from the base of the skull through the vertex without intravenous contrast. COMPARISON:  Earlier the same day FINDINGS: Brain: Complete left MCA territory infarction with stable degree of swelling effacing the left lateral ventricle and causing midline shift to 4 mm. No entrapment or visible interval infarct. Vascular: Prior aneurysm coiling at the right circle-of-Willis. Skull: Negative Sinuses/Orbits: Negative IMPRESSION: Acute left MCA territory infarct without hemorrhagic conversion or progressive swelling. Midline shift measures 4 mm. Electronically Signed   By: Marnee Spring M.D.   On: 08/19/2020 10:23   CT HEAD WO CONTRAST  Result Date: 08/26/2020 CLINICAL DATA:  Facial trauma. EXAM: CT HEAD WITHOUT CONTRAST CT CERVICAL SPINE WITHOUT CONTRAST TECHNIQUE: Multidetector CT imaging of the head and cervical spine was performed following the standard protocol without intravenous contrast. Multiplanar CT image reconstructions of the cervical spine were also generated. COMPARISON:  None. FINDINGS: CT HEAD FINDINGS Brain: There is a large area of hypodensity with loss of gray-white matter discrimination involving the left frontal, parietal, temporal, and occipital lobes consistent with an infarct, likely early subacute. Clinical correlation is recommended. There is mild associated mass effect and compression of left lateral ventricle. There is approximately 4 mm left right midline shift. There is no acute intracranial hemorrhage. No extra-axial fluid collection. Vascular: Right supraclinoid aneurysm clip. Skull: Normal. Negative for fracture or focal lesion. Sinuses/Orbits: Mild mucoperiosteal thickening of paranasal sinuses. No air-fluid level. The mastoid air cells are clear. Other: None CT CERVICAL SPINE FINDINGS Alignment: No acute subluxation. There is straightening of normal cervical lordosis which may be positional or due to muscle spasm. Skull base and vertebrae:  Acute fracture. Soft tissues and spinal canal: No prevertebral fluid or swelling. No visible canal hematoma. Disc levels:  No acute findings.  Mild degenerative changes. Upper chest: Negative. Other: Endotracheal tube and enteric tube are partially visualized. IMPRESSION: 1. Large left MCA territory infarct, likely early subacute. Clinical correlation is recommended. 2. No acute intracranial hemorrhage. 3. No acute/traumatic cervical spine pathology. These results were called by telephone at the time of interpretation on 08/16/2020 at 2:25 am to provider [facility] , who verbally acknowledged these results. Electronically Signed   By: Elgie Collard M.D.   On: 08/23/2020 02:30   CT Cervical Spine Wo Contrast  Result Date: 09/01/2020 CLINICAL DATA:  Facial trauma. EXAM: CT HEAD WITHOUT CONTRAST CT CERVICAL SPINE WITHOUT CONTRAST TECHNIQUE: Multidetector CT imaging of the head and cervical spine was performed following the standard protocol without intravenous contrast. Multiplanar CT image reconstructions of the cervical spine were also generated. COMPARISON:  None. FINDINGS: CT HEAD FINDINGS Brain: There is a large area of hypodensity with loss of gray-white matter discrimination involving the left frontal, parietal, temporal, and occipital lobes consistent with an infarct, likely early subacute. Clinical correlation is recommended. There is mild associated mass effect and compression of left lateral ventricle. There is approximately 4 mm left right midline shift. There is no acute intracranial hemorrhage. No extra-axial fluid collection. Vascular: Right supraclinoid aneurysm clip. Skull: Normal. Negative for fracture or focal lesion. Sinuses/Orbits: Mild mucoperiosteal thickening of paranasal sinuses. No air-fluid level. The mastoid air cells are clear. Other: None CT CERVICAL SPINE FINDINGS Alignment: No acute subluxation. There is straightening of normal cervical lordosis which may be positional or due to  muscle spasm. Skull base and vertebrae: Acute fracture. Soft tissues and spinal canal: No prevertebral fluid or swelling. No visible  canal hematoma. Disc levels:  No acute findings.  Mild degenerative changes. Upper chest: Negative. Other: Endotracheal tube and enteric tube are partially visualized. IMPRESSION: 1. Large left MCA territory infarct, likely early subacute. Clinical correlation is recommended. 2. No acute intracranial hemorrhage. 3. No acute/traumatic cervical spine pathology. These results were called by telephone at the time of interpretation on 09/01/2020 at 2:25 am to provider [facility] , who verbally acknowledged these results. Electronically Signed   By: Elgie Collard M.D.   On: 08/31/2020 02:30   MR ANGIO HEAD WO CONTRAST  Result Date: 08/30/2020 CLINICAL DATA:  Follow-up examination for acute stroke. EXAM: MRI HEAD WITHOUT CONTRAST MRA HEAD WITHOUT CONTRAST TECHNIQUE: Multiplanar, multi-echo pulse sequences of the brain and surrounding structures were acquired without intravenous contrast. Angiographic images of the Circle of Willis were acquired using MRA technique without intravenous contrast. COMPARISON:  CT from 08/10/2020. FINDINGS: MRI HEAD FINDINGS Brain: Extensive confluent diffusion abnormality seen involving the majority of the left cerebral hemisphere, consistent with a large evolving early subacute left MCA distribution infarct. There is involvement of the centrally the entirety of the left MCA territory. Extensive susceptibility artifact throughout the area of infarction, consistent with petechial hemorrhage. There is some intrinsic T1 hyperintensity at the level of the left basal ganglia, nonspecific, but raises the possibility for a degree of parenchymal hemorrhage (series 16, image 30). Associated mass effect has markedly worsened, with left-to-right shift now measuring up to 13 mm. Left lateral ventricle is largely effaced. Mild asymmetric dilatation of the right  lateral ventricle without frank transependymal flow of CSF. Basilar cisterns remain patent at this time. Few additional punctate foci of acute to early subacute ischemia noted involving the subcortical right parietal lobe (series 5, image 83), right basal ganglia (series 5, image 70), and ventral right midbrain (series 5, image 69). No associated hemorrhage. Underlying cerebral volume within normal limits. No visible mass lesion. No extra-axial fluid collection. Vascular: Absent flow voids seen within the left MCA distribution. Otherwise, major intracranial vascular flow voids are maintained. Skull and upper cervical spine: Craniocervical junction within normal limits. Visualized bone marrow signal intensity heterogeneous and diffusely decreased on T1 weighted sequence, nonspecific, but most commonly related to anemia, smoking, or obesity. Small benign hemangioma noted within the C3 vertebral body. No worrisome osseous lesions. No scalp soft tissue abnormality. Sinuses/Orbits: Globes and orbital soft tissues demonstrate no acute finding. Scattered mucosal thickening noted the paranasal sinuses. Right mastoid effusion noted. Patient is intubated. Other: None. MRA HEAD FINDINGS Anterior circulation: Visualized distal cervical segments of the internal carotid arteries are tortuous but patent with antegrade flow. Petrous segments patent bilaterally. Focal severe stenosis involving the proximal cavernous left ICA noted (series 5, image 66). Additional focal severe stenosis seen just distally at the supraclinoid left ICA (series 1037, image 17). Additional short-segment severe stenosis involving the contralateral cavernous right ICA (series 5, image 81). Siphons are otherwise irregular but patent radiology is patent without high-grade stenosis. Sequelae of prior coil embolization for a right PCOM aneurysm. Possible small 2 mm remnant at the base of the coil pack (series 5, image 95). Right MCA and its branches remain  patent and well perfused. Diffuse small vessel atheromatous irregularity throughout the right MCA branches. On the left, there is occlusion of the left M1 segment at its origin. No discernible flow seen distally within the left MCA distribution. A1 segments patent. Normal anterior communicating artery complex. Atheromatous irregularity throughout the anterior cerebral arteries which remain patent to their distal  aspects without high-grade stenosis. Posterior circulation: Both vertebral arteries are patent to the vertebrobasilar junction without high-grade stenosis. Right PICA origin patent. Left PICA not well seen. Basilar irregular but patent to its distal aspect without high-grade stenosis. Superior cerebral arteries patent bilaterally. Both PCAs primarily supplied via the basilar. Multifocal atheromatous irregularity throughout the PCAs bilaterally with associated focal moderate left P2 stenosis (series 1015, image 15). No other proximal high-grade or significant stenosis. Anatomic variants: None significant. IMPRESSION: MRI HEAD IMPRESSION: 1. Large evolving early subacute ischemic left MCA distribution infarct involving the entirety of the left MCA territory. Associated petechial hemorrhage throughout the area of infarction. Additionally, superimposed heterogeneous T1 hyperintensity at the level of the left basal ganglia raises the possibility for hemorrhagic transformation. Correlation with dedicated head CT recommended. 2. Marked interval worsening in left-to-right midline shift now measuring up to 13 mm. 3. Few additional punctate foci of acute to early subacute ischemia involving the subcortical right parietal lobe, right basal ganglia, and ventral right midbrain as above. MRA HEAD IMPRESSION: 1. Occlusion of the left M1 segment at its origin. No discernible flow distally within the left MCA distribution. 2. Moderate atheromatous change elsewhere throughout the intracranial circulation. Associated severe  multifocal stenoses about the carotid siphons, with additional moderate left P2 stenosis as above. 3. 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 Probable small 2 mm remnant at the base of the coil pack. Critical Value/emergent results were called by telephone at the time of interpretation on 08/30/2020 at 3:44 am to provider Dr. Otelia Limes, Who verbally acknowledged these results. Electronically Signed   By: Rise Mu M.D.   On: 08/30/2020 03:47   MR BRAIN WO CONTRAST  Result Date: 08/30/2020 CLINICAL DATA:  Follow-up examination for acute stroke. EXAM: MRI HEAD WITHOUT CONTRAST MRA HEAD WITHOUT CONTRAST TECHNIQUE: Multiplanar, multi-echo pulse sequences of the brain and surrounding structures were acquired without intravenous contrast. Angiographic images of the Circle of Willis were acquired using MRA technique without intravenous contrast. COMPARISON:  CT from 2020-09-23. FINDINGS: MRI HEAD FINDINGS Brain: Extensive confluent diffusion abnormality seen involving the majority of the left cerebral hemisphere, consistent with a large evolving early subacute left MCA distribution infarct. There is involvement of the centrally the entirety of the left MCA territory. Extensive susceptibility artifact throughout the area of infarction, consistent with petechial hemorrhage. There is some intrinsic T1 hyperintensity at the level of the left basal ganglia, nonspecific, but raises the possibility for a degree of parenchymal hemorrhage (series 16, image 30). Associated mass effect has markedly worsened, with left-to-right shift now measuring up to 13 mm. Left lateral ventricle is largely effaced. Mild asymmetric dilatation of the right lateral ventricle without frank transependymal flow of CSF. Basilar cisterns remain patent at this time. Few additional punctate foci of acute to early subacute ischemia noted involving the subcortical right parietal lobe (series 5, image 83), right basal ganglia  (series 5, image 70), and ventral right midbrain (series 5, image 69). No associated hemorrhage. Underlying cerebral volume within normal limits. No visible mass lesion. No extra-axial fluid collection. Vascular: Absent flow voids seen within the left MCA distribution. Otherwise, major intracranial vascular flow voids are maintained. Skull and upper cervical spine: Craniocervical junction within normal limits. Visualized bone marrow signal intensity heterogeneous and diffusely decreased on T1 weighted sequence, nonspecific, but most commonly related to anemia, smoking, or obesity. Small benign hemangioma noted within the C3 vertebral body. No worrisome osseous lesions. No scalp soft tissue abnormality. Sinuses/Orbits: Globes and orbital  soft tissues demonstrate no acute finding. Scattered mucosal thickening noted the paranasal sinuses. Right mastoid effusion noted. Patient is intubated. Other: None. MRA HEAD FINDINGS Anterior circulation: Visualized distal cervical segments of the internal carotid arteries are tortuous but patent with antegrade flow. Petrous segments patent bilaterally. Focal severe stenosis involving the proximal cavernous left ICA noted (series 5, image 66). Additional focal severe stenosis seen just distally at the supraclinoid left ICA (series 1037, image 17). Additional short-segment severe stenosis involving the contralateral cavernous right ICA (series 5, image 81). Siphons are otherwise irregular but patent radiology is patent without high-grade stenosis. Sequelae of prior coil embolization for a right PCOM aneurysm. Possible small 2 mm remnant at the base of the coil pack (series 5, image 95). Right MCA and its branches remain patent and well perfused. Diffuse small vessel atheromatous irregularity throughout the right MCA branches. On the left, there is occlusion of the left M1 segment at its origin. No discernible flow seen distally within the left MCA distribution. A1 segments patent.  Normal anterior communicating artery complex. Atheromatous irregularity throughout the anterior cerebral arteries which remain patent to their distal aspects without high-grade stenosis. Posterior circulation: Both vertebral arteries are patent to the vertebrobasilar junction without high-grade stenosis. Right PICA origin patent. Left PICA not well seen. Basilar irregular but patent to its distal aspect without high-grade stenosis. Superior cerebral arteries patent bilaterally. Both PCAs primarily supplied via the basilar. Multifocal atheromatous irregularity throughout the PCAs bilaterally with associated focal moderate left P2 stenosis (series 1015, image 15). No other proximal high-grade or significant stenosis. Anatomic variants: None significant. IMPRESSION: MRI HEAD IMPRESSION: 1. Large evolving early subacute ischemic left MCA distribution infarct involving the entirety of the left MCA territory. Associated petechial hemorrhage throughout the area of infarction. Additionally, superimposed heterogeneous T1 hyperintensity at the level of the left basal ganglia raises the possibility for hemorrhagic transformation. Correlation with dedicated head CT recommended. 2. Marked interval worsening in left-to-right midline shift now measuring up to 13 mm. 3. Few additional punctate foci of acute to early subacute ischemia involving the subcortical right parietal lobe, right basal ganglia, and ventral right midbrain as above. MRA HEAD IMPRESSION: 1. Occlusion of the left M1 segment at its origin. No discernible flow distally within the left MCA distribution. 2. Moderate atheromatous change elsewhere throughout the intracranial circulation. Associated severe multifocal stenoses about the carotid siphons, with additional moderate left P2 stenosis as above. 3. 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 Probable small 2 mm remnant at the base of the coil pack. Critical Value/emergent results were called by  telephone at the time of interpretation on 08/30/2020 at 3:44 am to provider Dr. Otelia Limes, Who verbally acknowledged these results. Electronically Signed   By: Rise Mu M.D.   On: 08/30/2020 03:47   DG CHEST PORT 1 VIEW  Result Date: 08/30/2020 CLINICAL DATA:  Acute respiratory failure with hypoxia EXAM: PORTABLE CHEST 1 VIEW COMPARISON:  08/28/20 FINDINGS: Endotracheal tube, NG tube and central venous line unchanged no pulmonary edema. No atelectasis IMPRESSION: 1. Stable support apparatus. 2. Lungs are clear. Electronically Signed   By: Genevive Bi M.D.   On: 08/30/2020 08:26   DG Chest Port 1 View  Result Date: 08/28/2020 CLINICAL DATA:  Fever EXAM: PORTABLE CHEST 1 VIEW COMPARISON:  2020-09-16 FINDINGS: Endotracheal tube seen 5.2 cm above the carina. Nasogastric tube tip within the expected mid body of the stomach. Right internal jugular central venous catheter tip within the superior cavoatrial junction.  There is mild right-sided volume loss with developing right basilar atelectasis. The lungs are otherwise clear. No pneumothorax or pleural effusion. Cardiac size within normal limits. Pulmonary vascularity is normal. No acute bone abnormality. IMPRESSION: Stable support lines and tubes. Developing mild right basilar atelectasis and associated right-sided volume loss. Electronically Signed   By: Helyn Numbers M.D.   On: 08/28/2020 00:13   DG CHEST PORT 1 VIEW  Result Date: 09-22-20 CLINICAL DATA:  Status post central line placement EXAM: PORTABLE CHEST 1 VIEW COMPARISON:  Comparison made with 22-Sep-2020. FINDINGS: Endotracheal tube in situ 3 cm above the carina. Gastric tube in place, tip in the proximal stomach, side port likely just above GE junction. Interval placement of a RIGHT-sided central venous access catheter via RIGHT IJ approach terminating at the caval to atrial junction. EKG leads project over the chest. No pneumothorax.  No consolidation.  No effusion. On limited  assessment there is no acute skeletal process. IMPRESSION: 1. Interval placement of a RIGHT-sided central venous access catheter terminating at the caval to atrial junction. No pneumothorax. 2. Gastric tube in place, tip in the proximal stomach, side port likely just above the GE junction. Consider advancing gastric tube for more optimal placement as outlined previously, between 5 and 7 cm. 3. No acute cardiopulmonary findings Electronically Signed   By: Donzetta Kohut M.D.   On: September 22, 2020 10:01   DG Chest Port 1 View  Result Date: 09/22/2020 CLINICAL DATA:  Found unresponsive EXAM: PORTABLE CHEST 1 VIEW COMPARISON:  None. FINDINGS: Support Apparatus: --Endotracheal tube: Tip at the level of the clavicular heads. --Enteric tube:Side port is at the gastroesophageal junction. Recommend advancing by 7 cm. --Vascular catheter(s):None --Other: None The heart size and mediastinal contours are within normal limits. The lungs are clear. No pleural effusion or pneumothorax. IMPRESSION: 1. Endotracheal tube tip at the level of the clavicular heads. 2. Orogastric tube side port at the gastroesophageal junction. Recommend advancing by 7 cm. Electronically Signed   By: Deatra Robinson M.D.   On: 2020/09/22 02:07   Overnight EEG with video  Result Date: 08/28/2020 Charlsie Quest, MD     08/28/2020  9:14 AM Patient Name: Zanobia Griebel MRN: [id number] Epilepsy Attending: Charlsie Quest Referring Physician/Provider: Dr Erick Blinks Duration: 2020-09-22 5621 to 08/28/2020 0748 Patient history: 60 y.o. 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 Found to have a large L MCA stroke with cerebral edema. She is outside the window for any intervention and her stroke appears completed. She was also noted to have twitching movements concerning for a seizure.  EEG to evaluate for seizures. Level of alertness:   comatose AEDs during EEG study: Propofol, LEV Technical aspects: This EEG study was done with scalp electrodes positioned according to the 10-20 International system of electrode placement. Electrical activity was acquired at a sampling rate of 500Hz  and reviewed with a high frequency filter of 70Hz  and a low frequency filter of 1Hz . EEG data were recorded continuously and digitally stored. Description: No clear posterior dominant rhythm was seen.  Sleep was characterized by sleep spindles (12 to 14 Hz), maximal frontocentral region. EEG showed continuous generalized and lateralized left hemisphere sharply contoured 5 to 6 Hz theta slowing as well as intermittent generalized 2 to 3 Hz delta slowing.  Hyperventilation and photic stimulation were not performed.   ABNORMALITY - Continuous  slow, generalized and lateralized left hemisphere IMPRESSION: This study is suggestive of cortical dysfunction in left hemisphere likely secondary to underlying structural abnormality/stroke.  There is also moderate to severe diffuse encephalopathy, nonspecific etiology but could be secondary to sedation. No seizures or epileptiform discharges were seen throughout the recording. Charlsie Quest   ECHOCARDIOGRAM COMPLETE  Result Date: 09/01/2020    ECHOCARDIOGRAM REPORT   Patient Name:   SAHILY BIDDLE Date of Exam: 08/19/2020 Medical Rec #:  [id number]          Height:       61.0 in Accession #:    [id number]         Weight:       190.0 lb Date of Birth:  06-20-1960         BSA:          1.848 m Patient Age:    59 years           BP:           151/65 mmHg Patient Gender: F                  HR:           94 bpm. Exam Location:  Inpatient Procedure: 2D Echo, Cardiac Doppler and Color Doppler Indications:    Stroke  History:        Patient has prior history of Echocardiogram examinations, most                 recent 12/01/2013. Risk Factors:Hypertension, Diabetes and                 Dyslipidemia.  Sonographer:    Ross Ludwig RDCS  (AE) Referring Phys: [id number] [facility]  Sonographer Comments: Echo performed with patient supine and on artificial respirator. IMPRESSIONS  1. Left ventricular ejection fraction, by estimation, is 65 to 70%. The left ventricle has normal function. The left ventricle has no regional wall motion abnormalities. There is severe left ventricular hypertrophy. Left ventricular diastolic parameters  are consistent with Grade I diastolic dysfunction (impaired relaxation).  2. Right ventricular systolic function is normal. The right ventricular size is normal. Tricuspid regurgitation signal is inadequate for assessing PA pressure.  3. The mitral valve is grossly normal. Trivial mitral valve regurgitation. No evidence of mitral stenosis. The mean mitral valve gradient is 2.0 mmHg.  4. The aortic valve is tricuspid. Aortic valve regurgitation is not visualized. Aortic valve mean gradient measures 7.0 mmHg.  5. The inferior vena cava is normal in size with greater than 50% respiratory variability, suggesting right atrial pressure of 3 mmHg. Comparison(s): No prior Echocardiogram. FINDINGS  Left Ventricle: Left ventricular ejection fraction, by estimation, is 65 to 70%. The left ventricle has normal function. The left ventricle has no regional wall motion abnormalities. The left ventricular internal cavity size was small. There is severe left ventricular hypertrophy. Left ventricular diastolic parameters are consistent with Grade I diastolic dysfunction (impaired relaxation). Right Ventricle: The right ventricular size is normal. No increase in right ventricular wall thickness. Right ventricular systolic function is normal. Tricuspid regurgitation signal is inadequate for assessing PA pressure. Left Atrium: Left atrial size was normal in size. Right Atrium: Right atrial size was normal in size. Pericardium: There is no evidence of pericardial effusion. Presence of pericardial fat pad. Mitral Valve: The mitral valve is grossly  normal. Trivial mitral valve regurgitation. No evidence of mitral valve stenosis. MV peak gradient, 9.5 mmHg. The mean mitral  valve gradient is 2.0 mmHg. Tricuspid Valve: The tricuspid valve is grossly normal. Tricuspid valve regurgitation is trivial. Aortic Valve: The aortic valve is tricuspid. There is mild aortic valve annular calcification. Aortic valve regurgitation is not visualized. Aortic valve mean gradient measures 7.0 mmHg. Aortic valve peak gradient measures 12.8 mmHg. Aortic valve area, by VTI measures 2.16 cm. Pulmonic Valve: The pulmonic valve was grossly normal. Pulmonic valve regurgitation is trivial. Aorta: The aortic root is normal in size and structure. Venous: The inferior vena cava is normal in size with greater than 50% respiratory variability, suggesting right atrial pressure of 3 mmHg. IAS/Shunts: No atrial level shunt detected by color flow Doppler.  LEFT VENTRICLE PLAX 2D LVIDd:         3.30 cm  Diastology LVIDs:         2.00 cm  LV e' medial:    4.57 cm/s LV PW:         1.70 cm  LV E/e' medial:  12.2 LV IVS:        1.80 cm  LV e' lateral:   4.57 cm/s LVOT diam:     1.90 cm  LV E/e' lateral: 12.2 LV SV:         53 LV SV Index:   29 LVOT Area:     2.84 cm  RIGHT VENTRICLE             IVC RV Basal diam:  2.60 cm     IVC diam: 1.50 cm RV S prime:     16.30 cm/s TAPSE (M-mode): 2.0 cm LEFT ATRIUM             Index       RIGHT ATRIUM           Index LA diam:        2.60 cm 1.41 cm/m  RA Area:     13.90 cm LA Vol (A2C):   47.6 ml 25.76 ml/m RA Volume:   31.10 ml  16.83 ml/m LA Vol (A4C):   33.2 ml 17.97 ml/m LA Biplane Vol: 40.6 ml 21.97 ml/m  AORTIC VALVE AV Area (Vmax):    1.85 cm AV Area (Vmean):   1.87 cm AV Area (VTI):     2.16 cm AV Vmax:           179.00 cm/s AV Vmean:          119.000 cm/s AV VTI:            0.247 m AV Peak Grad:      12.8 mmHg AV Mean Grad:      7.0 mmHg LVOT Vmax:         117.00 cm/s LVOT Vmean:        78.600 cm/s LVOT VTI:          0.188 m LVOT/AV VTI  ratio: 0.76  AORTA Ao Root diam: 2.80 cm Ao Asc diam:  3.00 cm MITRAL VALVE MV Area (PHT): 3.15 cm    SHUNTS MV Area VTI:   2.24 cm    Systemic VTI:  0.19 m MV Peak grad:  9.5 mmHg    Systemic Diam: 1.90 cm MV Mean grad:  2.0 mmHg MV Vmax:       1.54 m/s MV Vmean:      72.4 cm/s MV Decel Time: 241 msec MV E velocity: 55.90 cm/s MV A velocity: 70.20 cm/s MV E/A ratio:  0.80 Nona Dell MD Electronically signed by Nona Dell MD Signature Date/Time: 2020-09-04/1:37:29  PM    Final    VAS US CAROTID  Result Date: 08/28/2020 Carotid Arterial Duplex Study Patient Name:  ARYANNE GILLELAND  Date of Exam:   08/28/2020 Medical Rec #: [id number]           Accession #:    [id number] Date of Birth: 02/10/1960          Patient Gender: F Patient Age:   89 years Exam Location:  [facility] Procedure:      VAS US CAROTID Referring Phys: Marvel Plan --------------------------------------------------------------------------------  Indications:       CVA. Risk Factors:      Hypertension. Limitations        Today's exam was limited due to a central line and Bandages,                    patient positioning, patient immobility. Comparison Study:  No prior studies. Performing Technologist: Chanda Busing RVT  Examination Guidelines: A complete evaluation includes B-mode imaging, spectral Doppler, color Doppler, and power Doppler as needed of all accessible portions of each vessel. Bilateral testing is considered an integral part of a complete examination. Limited examinations for reoccurring indications may be performed as noted.  Right Carotid Findings: +----------+--------+--------+--------+------------------+--------------+           PSV cm/sEDV cm/sStenosisPlaque DescriptionComments       +----------+--------+--------+--------+------------------+--------------+ CCA Prox                                            Not visualized +----------+--------+--------+--------+------------------+--------------+  CCA Distal                                          Not visualized +----------+--------+--------+--------+------------------+--------------+ ICA Prox                                            Not visualized +----------+--------+--------+--------+------------------+--------------+ ICA Distal                                          Not visualized +----------+--------+--------+--------+------------------+--------------+ +----------+--------+-------+--------+-------------------+           PSV cm/sEDV cmsDescribeArm Pressure (mmHG) +----------+--------+-------+--------+-------------------+ [id number]                                         +----------+--------+-------+--------+-------------------+ +---------+--------+--------+--------------+ VertebralPSV cm/sEDV cm/sNot visualized +---------+--------+--------+--------------+  Left Carotid Findings: +----------+-------+--------+--------+----------------------+------------------+           PSV    EDV cm/sStenosisPlaque Description    Comments                     cm/s                                                            +----------+-------+--------+--------+----------------------+------------------+ CCA Prox  134    2               smooth and            tortuous                                            heterogenous                             +----------+-------+--------+--------+----------------------+------------------+ CCA Distal67     7               smooth and            High resistant                                      heterogenous          flow               +----------+-------+--------+--------+----------------------+------------------+ ICA Prox  52     7               smooth and            High resistant                                      heterogenous          flow                +----------+-------+--------+--------+----------------------+------------------+ ICA Distal24     5                                     High resistant                                                            flow               +----------+-------+--------+--------+----------------------+------------------+ ECA       41     5                                                        +----------+-------+--------+--------+----------------------+------------------+ +----------+--------+--------+--------+-------------------+           PSV cm/sEDV cm/sDescribeArm Pressure (mmHG) +----------+--------+--------+--------+-------------------+ [id number]                                         +----------+--------+--------+--------+-------------------+ +---------+--------+--+--------+--+----------------------------+ VertebralPSV cm/s75EDV cm/s18Antegrade and High resistant +---------+--------+--+--------+--+----------------------------+   Summary: Right Carotid: Unable to visualize due to central line, bandages, patient                positioning, and immobility. Left Carotid: Velocities in the  left ICA are consistent with a 1-39% stenosis.               High resistant waveforms are noted throughout the carotid system. Vertebrals: Left vertebral artery demonstrates high resistant flow. *See table(s) above for measurements and observations.  Electronically signed by Lemar Livings MD on 08/28/2020 at 3:06:01 PM.    Final    VAS [place] LOWER EXTREMITY VENOUS (DVT)  Result Date: 08/28/2020  Lower Venous DVT Study Patient Name:  PATSYE SULLIVANT  Date of Exam:   08/28/2020 Medical Rec #: [id number]           Accession #:    [id number] Date of Birth: July 28, 1960          Patient Gender: F Patient Age:   78 years Exam Location:  [facility] Procedure:      VAS [place] LOWER EXTREMITY VENOUS (DVT) Referring Phys: Marvel Plan  --------------------------------------------------------------------------------  Indications: Stroke.  Risk Factors: None identified. Comparison Study: No prior studies. Performing Technologist: Chanda Busing RVT  Examination Guidelines: A complete evaluation includes B-mode imaging, spectral Doppler, color Doppler, and power Doppler as needed of all accessible portions of each vessel. Bilateral testing is considered an integral part of a complete examination. Limited examinations for reoccurring indications may be performed as noted. The reflux portion of the exam is performed with the patient in reverse Trendelenburg.  +---------+---------------+---------+-----------+----------+--------------+ RIGHT    CompressibilityPhasicitySpontaneityPropertiesThrombus Aging +---------+---------------+---------+-----------+----------+--------------+ CFV      Full           Yes      Yes                                 +---------+---------------+---------+-----------+----------+--------------+ SFJ      Full                                                        +---------+---------------+---------+-----------+----------+--------------+ FV Prox  Full                                                        +---------+---------------+---------+-----------+----------+--------------+ FV Mid   Full                                                        +---------+---------------+---------+-----------+----------+--------------+ FV DistalFull                                                        +---------+---------------+---------+-----------+----------+--------------+ PFV      Full                                                        +---------+---------------+---------+-----------+----------+--------------+  POP      Full           Yes      Yes                                 +---------+---------------+---------+-----------+----------+--------------+ PTV      Full                                                         +---------+---------------+---------+-----------+----------+--------------+ PERO     Full                                                        +---------+---------------+---------+-----------+----------+--------------+   +---------+---------------+---------+-----------+----------+--------------+ LEFT     CompressibilityPhasicitySpontaneityPropertiesThrombus Aging +---------+---------------+---------+-----------+----------+--------------+ CFV      Full           Yes      Yes                                 +---------+---------------+---------+-----------+----------+--------------+ SFJ      Full                                                        +---------+---------------+---------+-----------+----------+--------------+ FV Prox  Full                                                        +---------+---------------+---------+-----------+----------+--------------+ FV Mid   Full                                                        +---------+---------------+---------+-----------+----------+--------------+ FV DistalFull                                                        +---------+---------------+---------+-----------+----------+--------------+ PFV      Full                                                        +---------+---------------+---------+-----------+----------+--------------+ POP      Full           Yes      Yes                                 +---------+---------------+---------+-----------+----------+--------------+  PTV      Full                                                        +---------+---------------+---------+-----------+----------+--------------+ PERO     Full                                                        +---------+---------------+---------+-----------+----------+--------------+     Summary: RIGHT: - There is no evidence of deep vein thrombosis in the  lower extremity.  - No cystic structure found in the popliteal fossa.  LEFT: - There is no evidence of deep vein thrombosis in the lower extremity.  - No cystic structure found in the popliteal fossa.  *See table(s) above for measurements and observations. Electronically signed by Lemar Livings MD on 08/28/2020 at 3:05:42 PM.    Final     Microbiology Recent Results (from the past 240 hour(s))  Resp Panel by RT-PCR (Flu A&B, Covid)     Status: None   Collection Time: 08/28/2020  2:30 AM   Specimen: Nasopharyngeal(NP) swabs in vial transport medium  Result Value Ref Range Status   SARS Coronavirus 2 by RT PCR NEGATIVE NEGATIVE Final    Comment: (NOTE) SARS-CoV-2 target nucleic acids are NOT DETECTED.  The SARS-CoV-2 RNA is generally detectable in upper respiratory specimens during the acute phase of infection. The lowest concentration of SARS-CoV-2 viral copies this assay can detect is 138 copies/mL. A negative result does not preclude SARS-Cov-2 infection and should not be used as the sole basis for treatment or other patient management decisions. A negative result may occur with  improper specimen collection/handling, submission of specimen other than nasopharyngeal swab, presence of viral mutation(s) within the areas targeted by this assay, and inadequate number of viral copies(<138 copies/mL). A negative result must be combined with clinical observations, patient history, and epidemiological information. The expected result is Negative.  Fact Sheet for Patients:  BloggerCourse.com  Fact Sheet for Healthcare Providers:  SeriousBroker.it  This test is no t yet approved or cleared by the [place] FDA and  has been authorized for detection and/or diagnosis of SARS-CoV-2 by FDA under an Emergency Use Authorization (EUA). This EUA will remain  in effect (meaning this test can be used) for the duration of the COVID-19 declaration under  Section 564(b)(1) of the Act, 21 U.S.C.section 360bbb-3(b)(1), unless the authorization is terminated  or revoked sooner.       Influenza A by PCR NEGATIVE NEGATIVE Final   Influenza B by PCR NEGATIVE NEGATIVE Final    Comment: (NOTE) The Xpert Xpress SARS-CoV-2/FLU/RSV plus assay is intended as an aid in the diagnosis of influenza from Nasopharyngeal swab specimens and should not be used as a sole basis for treatment. Nasal washings and aspirates are unacceptable for Xpert Xpress SARS-CoV-2/FLU/RSV testing.  Fact Sheet for Patients: BloggerCourse.com  Fact Sheet for Healthcare Providers: SeriousBroker.it  This test is not yet approved or cleared by the [place] FDA and has been authorized for detection and/or diagnosis of SARS-CoV-2 by FDA under an Emergency Use Authorization (EUA). This EUA will remain in effect (meaning this test can be used) for the  duration of the COVID-19 declaration under Section 564(b)(1) of the Act, 21 U.S.C. section 360bbb-3(b)(1), unless the authorization is terminated or revoked.  Performed at [facility], [address] [place], [place] [address]   Blood culture (routine x 2)     Status: Abnormal   Collection Time: 08/20/2020  2:45 AM   Specimen: BLOOD  Result Value Ref Range Status   Specimen Description BLOOD LEFT ARM  Final   Special Requests   Final    BOTTLES DRAWN AEROBIC AND ANAEROBIC Blood Culture results may not be optimal due to an inadequate volume of blood received in culture bottles   Culture  Setup Time   Final    GRAM NEGATIVE RODS ANAEROBIC BOTTLE ONLY CRITICAL RESULT CALLED TO, READ BACK BY AND VERIFIED WITH: PHARM D J.MILLEN ON [id number] AT 1800 BY E.PARRISH Performed at [facility], [address], [place], [place] [address]    Culture KLEBSIELLA PNEUMONIAE (A)  Final   Report Status 08/29/2020 FINAL  Final   Organism ID, Bacteria KLEBSIELLA PNEUMONIAE   Final      Susceptibility   Klebsiella pneumoniae - MIC*    AMPICILLIN >=32 RESISTANT Resistant     CEFAZOLIN <=4 SENSITIVE Sensitive     CEFEPIME <=0.12 SENSITIVE Sensitive     CEFTAZIDIME <=1 SENSITIVE Sensitive     CEFTRIAXONE <=0.25 SENSITIVE Sensitive     CIPROFLOXACIN <=0.25 SENSITIVE Sensitive     GENTAMICIN <=1 SENSITIVE Sensitive     IMIPENEM <=0.25 SENSITIVE Sensitive     TRIMETH/SULFA <=20 SENSITIVE Sensitive     AMPICILLIN/SULBACTAM 8 SENSITIVE Sensitive     PIP/TAZO <=4 SENSITIVE Sensitive     * KLEBSIELLA PNEUMONIAE  Blood Culture ID Panel (Reflexed)     Status: Abnormal   Collection Time: 08/26/2020  2:45 AM  Result Value Ref Range Status   Enterococcus faecalis NOT DETECTED NOT DETECTED Final   Enterococcus Faecium NOT DETECTED NOT DETECTED Final   Listeria monocytogenes NOT DETECTED NOT DETECTED Final   Staphylococcus species NOT DETECTED NOT DETECTED Final   Staphylococcus aureus (BCID) NOT DETECTED NOT DETECTED Final   Staphylococcus epidermidis NOT DETECTED NOT DETECTED Final   Staphylococcus lugdunensis NOT DETECTED NOT DETECTED Final   Streptococcus species NOT DETECTED NOT DETECTED Final   Streptococcus agalactiae NOT DETECTED NOT DETECTED Final   Streptococcus pneumoniae NOT DETECTED NOT DETECTED Final   Streptococcus pyogenes NOT DETECTED NOT DETECTED Final   A.calcoaceticus-baumannii NOT DETECTED NOT DETECTED Final   Bacteroides fragilis NOT DETECTED NOT DETECTED Final   Enterobacterales DETECTED (A) NOT DETECTED Final    Comment: Enterobacterales represent a large order of gram negative bacteria, not a single organism. CRITICAL RESULT CALLED TO, READ BACK BY AND VERIFIED WITH: PHARM D J.MILLEN ON [id number] AT 1800 BY E.PARRISH    Enterobacter cloacae complex NOT DETECTED NOT DETECTED Final   Escherichia coli NOT DETECTED NOT DETECTED Final   Klebsiella aerogenes NOT DETECTED NOT DETECTED Final   Klebsiella oxytoca NOT DETECTED NOT DETECTED Final    Klebsiella pneumoniae DETECTED (A) NOT DETECTED Final    Comment: CRITICAL RESULT CALLED TO, READ BACK BY AND VERIFIED WITH: PHARM D J.MILLEN ON [id number] AT 1800 BY E.PARRISH    Proteus species NOT DETECTED NOT DETECTED Final   Salmonella species NOT DETECTED NOT DETECTED Final   Serratia marcescens NOT DETECTED NOT DETECTED Final   Haemophilus influenzae NOT DETECTED NOT DETECTED Final   Neisseria meningitidis NOT DETECTED NOT DETECTED Final   Pseudomonas aeruginosa  NOT DETECTED NOT DETECTED Final   Stenotrophomonas maltophilia NOT DETECTED NOT DETECTED Final   Candida albicans NOT DETECTED NOT DETECTED Final   Candida auris NOT DETECTED NOT DETECTED Final   Candida glabrata NOT DETECTED NOT DETECTED Final   Candida krusei NOT DETECTED NOT DETECTED Final   Candida parapsilosis NOT DETECTED NOT DETECTED Final   Candida tropicalis NOT DETECTED NOT DETECTED Final   Cryptococcus neoformans/gattii NOT DETECTED NOT DETECTED Final   CTX-M ESBL NOT DETECTED NOT DETECTED Final   Carbapenem resistance IMP NOT DETECTED NOT DETECTED Final   Carbapenem resistance KPC NOT DETECTED NOT DETECTED Final   Carbapenem resistance NDM NOT DETECTED NOT DETECTED Final   Carbapenem resist OXA 48 LIKE NOT DETECTED NOT DETECTED Final   Carbapenem resistance VIM NOT DETECTED NOT DETECTED Final    Comment: Performed at [facility], [address] [place], [place] [address]  Blood culture (routine x 2)     Status: Abnormal   Collection Time: 2020/09/25  4:41 AM   Specimen: BLOOD  Result Value Ref Range Status   Specimen Description BLOOD LEFT ANTECUBITAL  Final   Special Requests   Final    BOTTLES DRAWN AEROBIC ONLY Blood Culture adequate volume   Culture  Setup Time   Final    GRAM NEGATIVE RODS AEROBIC BOTTLE ONLY CRITICAL VALUE NOTED.  VALUE IS CONSISTENT WITH PREVIOUSLY REPORTED AND CALLED VALUE.    Culture (A)  Final    KLEBSIELLA PNEUMONIAE SUSCEPTIBILITIES PERFORMED ON PREVIOUS CULTURE WITHIN  THE LAST 5 DAYS. Performed at [facility], [address] [place], [place] [address]    Report Status 08/29/2020 FINAL  Final  MRSA Next Gen by PCR, Nasal     Status: None   Collection Time: Sep 25, 2020  9:34 AM   Specimen: Nasal Mucosa; Nasal Swab  Result Value Ref Range Status   MRSA by PCR Next Gen NOT DETECTED NOT DETECTED Final    Comment: (NOTE) The GeneXpert MRSA Assay (FDA approved for NASAL specimens only), is one component of a comprehensive MRSA colonization surveillance program. It is not intended to diagnose MRSA infection nor to guide or monitor treatment for MRSA infections. Test performance is not FDA approved in patients less than 48 years old. Performed at [facility], [address] [place], [place] [address]     Lab Basic Metabolic Panel: Recent Labs  Lab 08/28/20 0345 08/28/20 1140 08/29/20 0438 08/30/20 0346 08/31/20 0415 08/31/20 1045 08/31/20 1554 08/31/20 2231 09/01/20 0421 09/01/20 1306 08/14/2020 0534  NA 159*  --    < > 160* 161*   < > 159* 160* 161* 157* 164*  K 3.6  --    < > 3.4* 3.3*  --   --   --  3.7 3.6 3.4*  CL 128*  --    < > >130* 130*  --   --   --  127* 125* >130*  CO2 24  --    < > 23 25  --   --   --  GLUCOSE 157*  --    < > 178* 124*  --   --   --  113* 249* 97  BUN 49*  --    < > 35* 34*  --   --   --  35* 29* 36*  CREATININE 1.68*  --    < > 1.24* 1.02*  --   --   --  1.06* 1.04*  1.73*  CALCIUM 8.7*  --    < > 8.7* 9.3  --   --   --  8.9 8.7* 8.9  MG 2.7*  --   --   --   --   --   --   --  3.1*  --  3.2*  PHOS <1.0* 4.5  --   --   --   --   --   --   --   --  1.9*   < > = values in this interval not displayed.   Liver Function Tests: Recent Labs  Lab 08/31/20 0415  AST 61*  ALT 26  ALKPHOS 80  BILITOT 0.4  PROT 6.3*  ALBUMIN 1.9*   No results for input(s): LIPASE, AMYLASE in the last 168 hours. No results for input(s): AMMONIA in the last 168 hours. CBC: Recent Labs  Lab 08/28/20 0345  08/30/20 0346 08/31/20 0415 09/01/20 0421 09-Sep-2020 0534  WBC 15.4* 13.9* 13.4* 11.1* 9.6  HGB 13.0 11.7* 11.7* 10.9* 10.7*  HCT 41.6 38.2 38.7 36.9 36.1  MCV 88.5 89.5 91.1 92.3 93.0  PLT 276 186 200 183 159   Cardiac Enzymes: Recent Labs  Lab 08/28/20 0345 08/30/20 0346 09/09/20 0534  CKTOTAL 1,117* 900* 462*   Sepsis Labs: Recent Labs  Lab 08/28/20 0007 08/28/20 0245 08/28/20 0345 08/28/20 1702 08/30/20 0346 08/31/20 0415 09/01/20 0421 09/09/20 0534  WBC  --   --    < >  --  13.9* 13.4* 11.1* 9.6  LATICACIDVEN 2.2* 2.6*  --  <0.3*  --   --   --   --    < > = values in this interval not displayed.    Procedures/Operations  See epic   Briant Sites 09/03/2020, 4:19 PM

## 2020-09-07 NOTE — Progress Notes (Addendum)
NAME:  Samantha [name], MRN:  [id number], DOB:  1960-07-27, LOS: 6 ADMISSION DATE:  28-Aug-2020, CONSULTATION DATE:  08/28/20 REFERRING MD: Dr. Daun Peacock, CHIEF COMPLAINT:  ALOC   History of Present Illness:  60 year old F that presented to the ER on 8/21 after being found down at home.  Family had not heard from the patient in 2 days. They went to the home and saw her on the bathroom floor through the window.  She was in respiratory distress, intubated on arrival to ER.  It was noted that her right arm and leg were completely flaccid prior to intubation.  Patient has a long history of hypertension. Initial workup significant for large L MCA territory infarct, likely subacute, without hemorrhage.  CT C-spine without acute findings.  Labs showed an initial glucose of >600, creatinine 2.15 (baseline <1), WBC 19k, CK 1130.  Pertinent  Medical History  Hypertension Cerebral aneurysm Diabetes mellitus type 2 Hyperlipidemia  Significant Hospital Events: Including procedures, antibiotic start and stop dates in addition to other pertinent events   8/21 Brought in from home, intubated, admit to ICU, R IJ CVC placed 8/21 Initial head CT with large L MCA infarct, likely subacute. ECHO with LVEF 65-70%, no RWMA, severe LVH, grade I diastolic dysfunction  8/22 EEG w/o seizure overnight. Off propofol since 10am.  8/23 Abx narrowed to cefazolin.  MRI/MRA Brain with large evolving early subacute ischemic L MCA distribution infarct involving the entirety of the left MCA territory, associated petechial hemorrhage throughout the area of infarction, possible hemorrhagic transformation in the left basal ganglia, marked worsening of left-to-right midline shift measuring 71mm. 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  EEG showed periodic discharges with triphasic morphology with low potential for seizures  Subjective   Hemodynamic  instability overnight   Hypoglycemic this morning   Looks like she had robust urine output yesterday/overnight -- urine is almost clear.  Objective   Blood pressure (!) 110/47, pulse 66, temperature (!) 96.3 F (35.7 C), temperature source Oral, resp. rate 18, height 5\' 1"  (1.549 m), weight 71.8 kg, SpO2 97 %.    Vent Mode: PRVC FiO2 (%):  [40 %] 40 % Set Rate:  [18 bmp] 18 bmp Vt Set:  [400 mL] 400 mL PEEP:  [5 cmH20] 5 cmH20 Plateau Pressure:  [18 cmH20-20 cmH20] 20 cmH20   Intake/Output Summary (Last 24 hours) at 08/14/2020 1014 Last data filed at 08/17/2020 0900 Gross per 24 hour  Intake 1428.34 ml  Output 2470 ml  Net -1041.66 ml   Filed Weights   08/31/20 0600 09/01/20 0600 08/17/2020 0500  Weight: 72.3 kg 71.8 kg 71.8 kg   Exam:   General:  Critically ill middle aged F intubated no sedation  Neuro:  Pupils are fixed and 65mm. There is no response to pain. She does not overbreathe vent  HEENT:  NCAT protuberant tongue. ETT secure  Cardiovascular: rr s1s2 no rgm cap refill < 3sec  Lungs:  CTAb mechanically supported. Symmetrical chest expansion  Abdomen:  soft ndnt + bowel sounds  Musculoskeletal:  No acute joint deformity no cyanosis or clubbing.  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 Family plans to transition to comfort care 8/27 thus repeat imaging not of great utility and I will defer at this time  -continue keppra  Acute respiratory failure 2/2 L MCA CVA  -Full vent support  Klebsiella bacteremia   -on cefazolin  DM P -SSI, standard scale  -glucose goal 140-180 -continue levemir 15 units BID   AKI Elevated CK  Hypernatremia, severe -- possible development of central DI  P  -trend bmp    Best Practice (right click and "Reselect all SmartList Selections" daily)  Diet/type: NPO DVT prophylaxis: LMWH GI prophylaxis: PPI Lines: Central line Foley:  Yes, and it is still needed Code Status:  DNR.  Plan is for transition to comfort care 8/27   Niece Samantha Beal "Bradly Bienenstock is her HCPOA).  Patient's husband is deceased and she does not have children.   CRITICAL CARE Performed by: Lanier Clam   Total critical care time: 39 minutes  Critical care time was exclusive of separately billable procedures and treating other patients.  Critical care was necessary to treat or prevent imminent or life-threatening deterioration.  Critical care was time spent personally by me on the following activities: development of treatment plan with patient and/or surrogate as well as nursing, discussions with consultants, evaluation of patient's response to treatment, examination of patient, obtaining history from patient or surrogate, ordering and performing treatments and interventions, ordering and review of laboratory studies, ordering and review of radiographic studies, pulse oximetry and re-evaluation of patient's condition.  Tessie Fass MSN, AGACNP-BC [facility] Pulmonary/Critical Care Medicine Amion for pager  08/17/2020, 10:14 AM

## 2020-09-07 NOTE — Progress Notes (Signed)
Inpatient Diabetes Program Recommendations  AACE/ADA: New Consensus Statement on Inpatient Glycemic Control (2015)  Target Ranges:  Prepandial:   less than 140 mg/dL      Peak postprandial:   less than 180 mg/dL (1-2 hours)      Critically ill patients:  140 - 180 mg/dL   Lab Results  Component Value Date   GLUCAP 136 (H) 08/18/2020   HGBA1C 12.1 (H) 09-15-20    Review of Glycemic Control Results for KINDEL, ROCHEFORT (MRN [id number]) as of 09/03/2020 09:53  Ref. Range 09/01/2020 11:15 09/01/2020 15:04 09/01/2020 19:24 09/01/2020 23:21 09/01/2020 01:30 08/20/2020 03:20 09/06/2020 03:50 08/31/2020 07:37 08/14/2020 08:04  Glucose-Capillary Latest Ref Range: 70 - 99 mg/dL 797 (H) 282 (H) 060 (H) 70 76 46 (L) 188 (H) 56 (L) 136 (H)    Inpatient Diabetes Program Recommendations:   Hypoglycemia in the 40's overnight. At current plan of care consider:  -  reduce Novolog Correction to 1-3 units Q4 hours -  reduce Levemir to 12 units bid  Thanks,  Christena Deem RN, MSN, BC-ADM Inpatient Diabetes Coordinator Team Pager [phone] (8a-5p)

## 2020-09-07 DEATH — deceased
